# Patient Record
Sex: Female | Born: 1935 | Race: White | Hispanic: No | Marital: Married | Smoking: Current some day smoker
Health system: Southern US, Community
[De-identification: ages and names within clinical notes are randomized; demographics above are authoritative.]

## PROBLEM LIST (undated history)

## (undated) DIAGNOSIS — Z720 Tobacco use: Secondary | ICD-10-CM

## (undated) DIAGNOSIS — I219 Acute myocardial infarction, unspecified: Secondary | ICD-10-CM

## (undated) DIAGNOSIS — I739 Peripheral vascular disease, unspecified: Secondary | ICD-10-CM

## (undated) DIAGNOSIS — J449 Chronic obstructive pulmonary disease, unspecified: Secondary | ICD-10-CM

## (undated) DIAGNOSIS — I10 Essential (primary) hypertension: Secondary | ICD-10-CM

## (undated) DIAGNOSIS — I509 Heart failure, unspecified: Secondary | ICD-10-CM

## (undated) DIAGNOSIS — M1990 Unspecified osteoarthritis, unspecified site: Secondary | ICD-10-CM

## (undated) HISTORY — PX: NO PAST SURGERIES: SHX2092

---

## 2002-09-27 ENCOUNTER — Ambulatory Visit (HOSPITAL_COMMUNITY): Admission: RE | Admit: 2002-09-27 | Discharge: 2002-09-27 | Payer: Self-pay | Admitting: Internal Medicine

## 2003-08-28 ENCOUNTER — Ambulatory Visit (HOSPITAL_COMMUNITY): Admission: RE | Admit: 2003-08-28 | Discharge: 2003-08-28 | Payer: Self-pay | Admitting: Pediatrics

## 2004-08-01 ENCOUNTER — Ambulatory Visit (HOSPITAL_COMMUNITY): Admission: RE | Admit: 2004-08-01 | Discharge: 2004-08-01 | Payer: Self-pay | Admitting: Family Medicine

## 2005-03-31 ENCOUNTER — Ambulatory Visit (HOSPITAL_COMMUNITY): Admission: RE | Admit: 2005-03-31 | Discharge: 2005-03-31 | Payer: Self-pay | Admitting: Family Medicine

## 2006-10-23 ENCOUNTER — Ambulatory Visit (HOSPITAL_COMMUNITY): Admission: RE | Admit: 2006-10-23 | Discharge: 2006-10-23 | Payer: Self-pay | Admitting: Family Medicine

## 2006-11-27 ENCOUNTER — Ambulatory Visit (HOSPITAL_COMMUNITY): Admission: RE | Admit: 2006-11-27 | Discharge: 2006-11-27 | Payer: Self-pay | Admitting: Family Medicine

## 2007-09-23 ENCOUNTER — Ambulatory Visit (HOSPITAL_COMMUNITY): Admission: RE | Admit: 2007-09-23 | Discharge: 2007-09-23 | Payer: Self-pay | Admitting: Pediatrics

## 2007-10-01 ENCOUNTER — Encounter: Admission: RE | Admit: 2007-10-01 | Discharge: 2007-10-01 | Payer: Self-pay | Admitting: Pediatrics

## 2008-11-10 ENCOUNTER — Ambulatory Visit (HOSPITAL_COMMUNITY): Admission: RE | Admit: 2008-11-10 | Discharge: 2008-11-10 | Payer: Self-pay | Admitting: Pediatrics

## 2008-11-17 ENCOUNTER — Ambulatory Visit (HOSPITAL_COMMUNITY): Admission: RE | Admit: 2008-11-17 | Discharge: 2008-11-17 | Payer: Self-pay | Admitting: Family Medicine

## 2009-02-08 ENCOUNTER — Ambulatory Visit (HOSPITAL_COMMUNITY): Admission: RE | Admit: 2009-02-08 | Discharge: 2009-02-08 | Payer: Self-pay | Admitting: Family Medicine

## 2010-11-09 ENCOUNTER — Encounter: Payer: Self-pay | Admitting: Pediatrics

## 2010-11-10 ENCOUNTER — Encounter: Payer: Self-pay | Admitting: Family Medicine

## 2011-03-07 NOTE — Op Note (Signed)
NAME:  Erica Howard, Erica Howard                        ACCOUNT NO.:  [id number]   MEDICAL RECORD NO.:  [id number]                   PATIENT TYPE:  AMB   LOCATION:  DAY                                  FACILITY:  [facility]   PHYSICIAN:  R. Roetta Sessions, M.D.              DATE OF BIRTH:  October 22, 1926   DATE OF PROCEDURE:  09/27/2002  DATE OF DISCHARGE:                                 OPERATIVE REPORT   PROCEDURE:  Diagnostic colonoscopy.   ENDOSCOPIST:  Gerrit Friends. Rourk, M.D.   INDICATIONS FOR PROCEDURE:  The patient is a 77 year old lady who was found  to have 3 Hemoccults positive recently.  She is devoid of any lower GI tract  symptoms such as hematochezia, no melena.  No change in bowel habits.  No  abdominal pain.  Previous colonoscopy demonstrated only inflammatory polyps.  She does not have any upper GI tract symptoms such as odynophagia,  dysphagia, early satiety, reflux symptoms, nausea or vomiting.  Her CBC from  today is white count 8.5, H&H 16.0 and 48.6.  MCV 90.4.  Colonoscopy is now  being done to further evaluate Hemoccult positive stools.  This approach has  been discussed with the patient previously.  The potential risks, benefits,  and alternatives have been reviewed.  Questions answered.  Please see my  09/08/02 office dictated H&P for more information.   DESCRIPTION OF PROCEDURE:  O2 saturation, blood pressure, pulse and  respirations were monitored throughout the entire procedure.   CONSCIOUS SEDATION:  Versed 3 mg IV, Demerol 50 mg IV in divided doses.   INSTRUMENT:  Olympus video chip adult colonoscope.   FINDINGS:  Digital rectal exam revealed external hemorrhoids.   ENDOSCOPIC FINDINGS:  The prep was adequate.   RECTUM:  Examination of the rectal mucosa including the retroflex view of  the anal verge revealed only internal hemorrhoids.  Otherwise the rectal  mucosa appeared normal.   COLON:  The colonic mucosa was surveyed from the rectosigmoid junction  through  the left transverse and right colon to the area of the appendiceal  orifice, ileocecal valve, and cecum.  These structures were well seen and  photographed for the record.  The patient was noted to have left-sided  diverticulum.  The remainder of the colonic mucosa to the cecum appeared  normal.   From the level of the cecum and ileocecal valve the scope was slowly  withdrawn.  All previously mentioned mucosal surfaces were again seen; and,  again, no abnormalities were observed.  The patient tolerated the procedure  well and was reacted in endoscopy.  Internal and external hemorrhoids,  otherwise normal rectum.  Left-sided diverticula.  The remainder of the  colonic mucosa appeared normal.   IMPRESSION:  The patient may have bleed from hemorrhoids or may have false-  positive Hemoccults from dietary indiscretion prior to testing.  Today's  findings were reassuring.   RECOMMENDATIONS:  1. Hemorrhoidal  literature, diverticulosis literature provided to the     patient.  2. Repeat colonoscopy in 10 years.  3. Follow up with Dr. Milford Cage.                                               Jonathon Bellows, M.D.    RMR/MEDQ  D:  09/27/2002  T:  09/27/2002  Job:  [id number]   cc:   Francoise Schaumann. Halm, D.O.  [address]  [place]  [place] [address]  Fax: [phone]

## 2012-11-18 ENCOUNTER — Other Ambulatory Visit (HOSPITAL_COMMUNITY): Payer: Self-pay | Admitting: Internal Medicine

## 2012-11-18 DIAGNOSIS — I714 Abdominal aortic aneurysm, without rupture, unspecified: Secondary | ICD-10-CM

## 2012-11-18 DIAGNOSIS — I739 Peripheral vascular disease, unspecified: Secondary | ICD-10-CM

## 2012-11-19 ENCOUNTER — Ambulatory Visit (HOSPITAL_COMMUNITY): Payer: Medicare Other

## 2012-11-20 ENCOUNTER — Inpatient Hospital Stay (HOSPITAL_COMMUNITY)
Admission: AD | Admit: 2012-11-20 | Discharge: 2012-11-26 | DRG: 280 | Disposition: A | Discharge status: Home / Self Care | Payer: Medicare Other | Source: Other Acute Inpatient Hospital | Attending: Internal Medicine | Admitting: Internal Medicine

## 2012-11-20 DIAGNOSIS — I5181 Takotsubo syndrome: Secondary | ICD-10-CM | POA: Diagnosis present

## 2012-11-20 DIAGNOSIS — M810 Age-related osteoporosis without current pathological fracture: Secondary | ICD-10-CM | POA: Diagnosis present

## 2012-11-20 DIAGNOSIS — T380X5A Adverse effect of glucocorticoids and synthetic analogues, initial encounter: Secondary | ICD-10-CM | POA: Diagnosis present

## 2012-11-20 DIAGNOSIS — Z79899 Other long term (current) drug therapy: Secondary | ICD-10-CM

## 2012-11-20 DIAGNOSIS — I10 Essential (primary) hypertension: Secondary | ICD-10-CM | POA: Diagnosis present

## 2012-11-20 DIAGNOSIS — I5021 Acute systolic (congestive) heart failure: Secondary | ICD-10-CM

## 2012-11-20 DIAGNOSIS — E871 Hypo-osmolality and hyponatremia: Secondary | ICD-10-CM | POA: Diagnosis present

## 2012-11-20 DIAGNOSIS — E876 Hypokalemia: Secondary | ICD-10-CM | POA: Diagnosis not present

## 2012-11-20 DIAGNOSIS — I70209 Unspecified atherosclerosis of native arteries of extremities, unspecified extremity: Secondary | ICD-10-CM | POA: Diagnosis present

## 2012-11-20 DIAGNOSIS — J9600 Acute respiratory failure, unspecified whether with hypoxia or hypercapnia: Secondary | ICD-10-CM | POA: Diagnosis present

## 2012-11-20 DIAGNOSIS — J441 Chronic obstructive pulmonary disease with (acute) exacerbation: Secondary | ICD-10-CM | POA: Diagnosis present

## 2012-11-20 DIAGNOSIS — I4891 Unspecified atrial fibrillation: Secondary | ICD-10-CM | POA: Diagnosis not present

## 2012-11-20 DIAGNOSIS — J449 Chronic obstructive pulmonary disease, unspecified: Secondary | ICD-10-CM | POA: Diagnosis present

## 2012-11-20 DIAGNOSIS — R9431 Abnormal electrocardiogram [ECG] [EKG]: Secondary | ICD-10-CM | POA: Diagnosis present

## 2012-11-20 DIAGNOSIS — I509 Heart failure, unspecified: Secondary | ICD-10-CM

## 2012-11-20 DIAGNOSIS — I219 Acute myocardial infarction, unspecified: Secondary | ICD-10-CM

## 2012-11-20 DIAGNOSIS — I214 Non-ST elevation (NSTEMI) myocardial infarction: Principal | ICD-10-CM | POA: Diagnosis present

## 2012-11-20 DIAGNOSIS — Z7982 Long term (current) use of aspirin: Secondary | ICD-10-CM

## 2012-11-20 DIAGNOSIS — D72829 Elevated white blood cell count, unspecified: Secondary | ICD-10-CM | POA: Diagnosis present

## 2012-11-20 HISTORY — DX: Chronic obstructive pulmonary disease, unspecified: J44.9

## 2012-11-20 HISTORY — DX: Acute myocardial infarction, unspecified: I21.9

## 2012-11-20 HISTORY — DX: Essential (primary) hypertension: I10

## 2012-11-20 HISTORY — DX: Peripheral vascular disease, unspecified: I73.9

## 2012-11-20 HISTORY — DX: Heart failure, unspecified: I50.9

## 2012-11-20 HISTORY — DX: Tobacco use: Z72.0

## 2012-11-20 HISTORY — DX: Unspecified osteoarthritis, unspecified site: M19.90

## 2012-11-20 NOTE — Progress Notes (Signed)
Pt brought in by [facility] from [facility] hospital on the bipap

## 2012-11-21 ENCOUNTER — Encounter (HOSPITAL_COMMUNITY): Payer: Self-pay | Admitting: *Deleted

## 2012-11-21 ENCOUNTER — Inpatient Hospital Stay (HOSPITAL_COMMUNITY): Payer: Medicare Other

## 2012-11-21 DIAGNOSIS — I214 Non-ST elevation (NSTEMI) myocardial infarction: Secondary | ICD-10-CM | POA: Diagnosis present

## 2012-11-21 DIAGNOSIS — J441 Chronic obstructive pulmonary disease with (acute) exacerbation: Secondary | ICD-10-CM

## 2012-11-21 DIAGNOSIS — R072 Precordial pain: Secondary | ICD-10-CM

## 2012-11-21 DIAGNOSIS — R918 Other nonspecific abnormal finding of lung field: Secondary | ICD-10-CM

## 2012-11-21 DIAGNOSIS — R9431 Abnormal electrocardiogram [ECG] [EKG]: Secondary | ICD-10-CM | POA: Diagnosis present

## 2012-11-21 DIAGNOSIS — J9600 Acute respiratory failure, unspecified whether with hypoxia or hypercapnia: Secondary | ICD-10-CM

## 2012-11-21 LAB — CBC WITH DIFFERENTIAL/PLATELET
Eosinophils Absolute: 0 10*3/uL (ref 0.0–0.7)
Eosinophils Relative: 0 % (ref 0–5)
HCT: 38.6 % (ref 36.0–46.0)
Lymphocytes Relative: 2 % — ABNORMAL LOW (ref 12–46)
Lymphs Abs: 0.3 10*3/uL — ABNORMAL LOW (ref 0.7–4.0)
MCH: 30.5 pg (ref 26.0–34.0)
MCV: 88.5 fL (ref 78.0–100.0)
Monocytes Absolute: 0.6 10*3/uL (ref 0.1–1.0)
Platelets: 262 10*3/uL (ref 150–400)
RBC: 4.36 MIL/uL (ref 3.87–5.11)

## 2012-11-21 LAB — CBC
HCT: 37.2 % (ref 36.0–46.0)
MCH: 30.5 pg (ref 26.0–34.0)
MCV: 87.3 fL (ref 78.0–100.0)
Platelets: 271 10*3/uL (ref 150–400)
RDW: 13.6 % (ref 11.5–15.5)
WBC: 10.2 10*3/uL (ref 4.0–10.5)

## 2012-11-21 LAB — TROPONIN I
Troponin I: 1.28 ng/mL (ref <0.30)
Troponin I: 1.12 ng/mL (ref <0.30)

## 2012-11-21 LAB — URINALYSIS, ROUTINE W REFLEX MICROSCOPIC
Glucose, UA: NEGATIVE mg/dL
Hgb urine dipstick: NEGATIVE
Leukocytes, UA: NEGATIVE
Specific Gravity, Urine: 1.013 (ref 1.005–1.030)
pH: 5.5 (ref 5.0–8.0)

## 2012-11-21 LAB — LEGIONELLA ANTIGEN, URINE: Legionella Antigen, Urine: NEGATIVE

## 2012-11-21 LAB — POCT I-STAT 3, ART BLOOD GAS (G3+)
Acid-Base Excess: 1 mmol/L (ref 0.0–2.0)
O2 Saturation: 96 %
Patient temperature: 98.6
pO2, Arterial: 78 mmHg — ABNORMAL LOW (ref 80.0–100.0)

## 2012-11-21 LAB — COMPREHENSIVE METABOLIC PANEL
Albumin: 3.5 g/dL (ref 3.5–5.2)
BUN: 29 mg/dL — ABNORMAL HIGH (ref 6–23)
Creatinine, Ser: 0.94 mg/dL (ref 0.50–1.10)
GFR calc Af Amer: 63 mL/min — ABNORMAL LOW (ref >90)
Glucose, Bld: 170 mg/dL — ABNORMAL HIGH (ref 70–99)
Total Bilirubin: 0.6 mg/dL (ref 0.3–1.2)
Total Protein: 6.2 g/dL (ref 6.0–8.3)

## 2012-11-21 LAB — PROTIME-INR: Prothrombin Time: 13.6 seconds (ref 11.6–15.2)

## 2012-11-21 LAB — MAGNESIUM: Magnesium: 1.5 mg/dL (ref 1.5–2.5)

## 2012-11-21 LAB — PHOSPHORUS: Phosphorus: 3.8 mg/dL (ref 2.3–4.6)

## 2012-11-21 LAB — STREP PNEUMONIAE URINARY ANTIGEN: Strep Pneumo Urinary Antigen: NEGATIVE

## 2012-11-21 MED ORDER — CARVEDILOL 3.125 MG PO TABS
3.1250 mg | ORAL_TABLET | Freq: Two times a day (BID) | ORAL | Status: DC
  Administered 2012-11-21 – 2012-11-26 (×10): 3.125 mg via ORAL
  Filled 2012-11-21 (×12): qty 1

## 2012-11-21 MED ORDER — SODIUM CHLORIDE 0.9 % IV SOLN: 250.0000 mL | INTRAVENOUS | Status: DC | PRN

## 2012-11-21 MED ORDER — ASPIRIN 81 MG PO CHEW
81.0000 mg | CHEWABLE_TABLET | Freq: Every day | ORAL | Status: DC
  Administered 2012-11-21 – 2012-11-26 (×5): 81 mg via ORAL
  Filled 2012-11-21 (×5): qty 1

## 2012-11-21 MED ORDER — AMIODARONE LOAD VIA INFUSION
150.0000 mg | Freq: Once | INTRAVENOUS | Status: AC
  Administered 2012-11-21: 150 mg via INTRAVENOUS
  Filled 2012-11-21: qty 83.34

## 2012-11-21 MED ORDER — PANTOPRAZOLE SODIUM 40 MG IV SOLR
40.0000 mg | Freq: Every day | INTRAVENOUS | Status: DC
  Administered 2012-11-21 – 2012-11-22 (×3): 40 mg via INTRAVENOUS
  Filled 2012-11-21 (×4): qty 40

## 2012-11-21 MED ORDER — DIGOXIN 125 MCG PO TABS
0.1250 mg | ORAL_TABLET | Freq: Every day | ORAL | Status: DC
Start: 2012-11-22 — End: 2012-11-25
  Administered 2012-11-22 – 2012-11-24 (×3): 0.125 mg via ORAL
  Filled 2012-11-21 (×4): qty 1

## 2012-11-21 MED ORDER — LISINOPRIL 2.5 MG PO TABS
2.5000 mg | ORAL_TABLET | Freq: Every day | ORAL | Status: DC
  Filled 2012-11-21: qty 1

## 2012-11-21 MED ORDER — HYDROCODONE-ACETAMINOPHEN 5-325 MG PO TABS
1.0000 | ORAL_TABLET | ORAL | Status: DC | PRN
  Administered 2012-11-21 – 2012-11-26 (×11): 1 via ORAL
  Filled 2012-11-21 (×12): qty 1

## 2012-11-21 MED ORDER — BIOTENE DRY MOUTH MT LIQD
15.0000 mL | Freq: Two times a day (BID) | OROMUCOSAL | Status: DC
  Administered 2012-11-21 – 2012-11-22 (×3): 15 mL via OROMUCOSAL

## 2012-11-21 MED ORDER — AMIODARONE HCL IN DEXTROSE 360-4.14 MG/200ML-% IV SOLN: 60.0000 mg/h | INTRAVENOUS | Status: AC

## 2012-11-21 MED ORDER — ALBUTEROL SULFATE HFA 108 (90 BASE) MCG/ACT IN AERS
4.0000 | INHALATION_SPRAY | RESPIRATORY_TRACT | Status: DC | PRN
  Filled 2012-11-21: qty 6.7

## 2012-11-21 MED ORDER — ASPIRIN 81 MG PO CHEW
324.0000 mg | CHEWABLE_TABLET | ORAL | Status: AC
  Administered 2012-11-22: 324 mg via ORAL
  Filled 2012-11-21: qty 4

## 2012-11-21 MED ORDER — SODIUM CHLORIDE 0.9 % IV SOLN
250.0000 mL | INTRAVENOUS | Status: DC | PRN
  Administered 2012-11-20: 250 mL via INTRAVENOUS

## 2012-11-21 MED ORDER — HEPARIN (PORCINE) IN NACL 100-0.45 UNIT/ML-% IJ SOLN
900.0000 [IU]/h | INTRAMUSCULAR | Status: DC
  Administered 2012-11-20 – 2012-11-21 (×2): 800 [IU]/h via INTRAVENOUS
  Filled 2012-11-21 (×3): qty 250

## 2012-11-21 MED ORDER — FUROSEMIDE 10 MG/ML IJ SOLN
40.0000 mg | Freq: Two times a day (BID) | INTRAMUSCULAR | Status: DC
  Administered 2012-11-21 – 2012-11-23 (×6): 40 mg via INTRAVENOUS
  Filled 2012-11-21 (×10): qty 4

## 2012-11-21 MED ORDER — ASPIRIN 81 MG PO CHEW: 324.0000 mg | CHEWABLE_TABLET | ORAL | Status: AC

## 2012-11-21 MED ORDER — IPRATROPIUM-ALBUTEROL 18-103 MCG/ACT IN AERO
4.0000 | INHALATION_SPRAY | RESPIRATORY_TRACT | Status: DC
  Administered 2012-11-21 – 2012-11-25 (×23): 4 via RESPIRATORY_TRACT
  Filled 2012-11-21 (×3): qty 14.7

## 2012-11-21 MED ORDER — AMIODARONE HCL IN DEXTROSE 360-4.14 MG/200ML-% IV SOLN
INTRAVENOUS | Status: AC
  Administered 2012-11-21: 30.06 mg/h via INTRAVENOUS
  Filled 2012-11-21: qty 200

## 2012-11-21 MED ORDER — DEXTROSE 5 % IV SOLN
1.0000 g | Freq: Every day | INTRAVENOUS | Status: DC
  Administered 2012-11-21 – 2012-11-25 (×6): 1 g via INTRAVENOUS
  Filled 2012-11-21 (×8): qty 10

## 2012-11-21 MED ORDER — DEXTROSE 5 % IV SOLN
500.0000 mg | INTRAVENOUS | Status: DC
  Administered 2012-11-21 – 2012-11-23 (×4): 500 mg via INTRAVENOUS
  Filled 2012-11-21 (×4): qty 500

## 2012-11-21 MED ORDER — AMIODARONE HCL IN DEXTROSE 360-4.14 MG/200ML-% IV SOLN
30.0000 mg/h | INTRAVENOUS | Status: DC
  Administered 2012-11-21: 30 mg/h via INTRAVENOUS
  Administered 2012-11-21: 30.06 mg/h via INTRAVENOUS
  Administered 2012-11-22: 30 mg/h via INTRAVENOUS
  Filled 2012-11-21 (×6): qty 200

## 2012-11-21 MED ORDER — MORPHINE SULFATE 2 MG/ML IJ SOLN
2.0000 mg | INTRAMUSCULAR | Status: DC | PRN
  Administered 2012-11-21: 2 mg via INTRAVENOUS
  Filled 2012-11-21: qty 1

## 2012-11-21 MED ORDER — SODIUM CHLORIDE 0.9 % IJ SOLN
3.0000 mL | Freq: Two times a day (BID) | INTRAMUSCULAR | Status: DC
  Administered 2012-11-22: 3 mL via INTRAVENOUS

## 2012-11-21 MED ORDER — METHYLPREDNISOLONE SODIUM SUCC 125 MG IJ SOLR
60.0000 mg | Freq: Two times a day (BID) | INTRAMUSCULAR | Status: DC
  Administered 2012-11-22 – 2012-11-24 (×5): 60 mg via INTRAVENOUS
  Filled 2012-11-21 (×10): qty 0.96

## 2012-11-21 MED ORDER — SODIUM CHLORIDE 0.9 % IJ SOLN: 3.0000 mL | INTRAMUSCULAR | Status: DC | PRN

## 2012-11-21 MED ORDER — DIGOXIN 0.25 MG/ML IJ SOLN
0.1250 mg | Freq: Once | INTRAMUSCULAR | Status: AC
  Administered 2012-11-22: 0.125 mg via INTRAVENOUS
  Filled 2012-11-21 (×2): qty 0.5

## 2012-11-21 MED ORDER — ASPIRIN 300 MG RE SUPP: 300.0000 mg | RECTAL | Status: AC

## 2012-11-21 MED ORDER — CHLORHEXIDINE GLUCONATE 0.12 % MT SOLN
15.0000 mL | Freq: Two times a day (BID) | OROMUCOSAL | Status: DC
  Administered 2012-11-22: 15 mL via OROMUCOSAL

## 2012-11-21 NOTE — Progress Notes (Signed)
Back pain;   Start Vicodin

## 2012-11-21 NOTE — Progress Notes (Signed)
Pt on standby with bipap at this time.  Saturations are at 94% on a 3lpm [place].

## 2012-11-21 NOTE — Progress Notes (Signed)
  Echocardiogram 2D Echocardiogram has been performed.  Georgian Co 11/21/2012, 9:44 AM

## 2012-11-21 NOTE — Progress Notes (Addendum)
Patient ID: Erica Howard, female   DOB: [birth year], 79 y.o.   MRN: [id number]    SUBJECTIVE: Patient is on Bipap this morning.  She denies chest pain.  Still some dyspnea.  She is getting her echo done now: I reviewed the available pictures so far.  She has EF 25-30% with mid to apical severe hypokinesis of all walls.  The basal segments are preserved.  This could be consistent with CAD but also would be concerned for stress (Takotsubo-type) cardiomyopathy.      [name] albuterol-ipratropium  4 puff Inhalation Q4H  . antiseptic oral rinse  15 mL Mouth Rinse q12n4p  . aspirin  324 mg Oral NOW   Or  . aspirin  300 mg Rectal NOW  . azithromycin  500 mg Intravenous Q24H  . cefTRIAXone (ROCEPHIN)  IV  1 g Intravenous QHS  . chlorhexidine  15 mL Mouth Rinse BID  . methylPREDNISolone (SOLU-MEDROL) injection  60 mg Intravenous Q12H  . pantoprazole (PROTONIX) IV  40 mg Intravenous QHS      Filed Vitals:   11/21/12 0500 11/21/12 0600 11/21/12 0700 11/21/12 0800  BP: 99/47 101/56 101/57 103/57  Pulse: 103 103 106 104  Temp:      TempSrc:      Resp: 20 21 21 21   Height:      Weight: 139 lb 15.9 oz (63.5 kg)     SpO2: 97% 98% 96% 96%    Intake/Output Summary (Last 24 hours) at 11/21/12 0845 Last data filed at 11/21/12 0700  Gross per 24 hour  Intake  448.5 ml  Output    625 ml  Net -176.5 ml    LABS: Basic Metabolic Panel:  Basename 11/21/12 0026  NA 128*  K 3.7  CL 88*  CO2 25  GLUCOSE 170*  BUN 29*  CREATININE 0.94  CALCIUM 10.8*  MG 1.5  PHOS 3.8   Liver Function Tests:  Basename 11/21/12 0026  AST 37  ALT 29  ALKPHOS 44  BILITOT 0.6  PROT 6.2  ALBUMIN 3.5   No results found for this basename: LIPASE:2,AMYLASE:2 in the last 72 hours CBC:  Basename 11/21/12 0545 11/21/12 0026  WBC 10.2 14.5*  NEUTROABS -- 13.5*  HGB 13.0 13.3  HCT 37.2 38.6  MCV 87.3 88.5  PLT 271 262   Cardiac Enzymes:  Basename 11/21/12 0545 11/21/12 0018  CKTOTAL -- --  CKMB -- --    CKMBINDEX -- --  TROPONINI 1.28* 1.36*   BNP:18,900  RADIOLOGY: Portable Chest Xray  11/21/2012  *RADIOLOGY REPORT*  Clinical Data: Short of breath.  Frequent falls.  Weakness.  PORTABLE CHEST - 1 VIEW  Comparison: None.  Findings: Mild cardiomegaly is seen as well as ectasia of the thoracic aorta.  Diffuse interstitial infiltrates are seen suspicious for mild interstitial edema.  No evidence of pulmonary air space disease or pleural effusion.  IMPRESSION: Mild cardiomegaly and diffuse interstitial infiltrates, suspicious for mild interstitial edema/congestive heart failure.   Original Report Authenticated By: [name], M.D.     PHYSICAL EXAM General: NAD, on Bipap Neck: JVP 8-9 cm, no thyromegaly or thyroid nodule.  Lungs: Coarse breath sounds bilaterally CV: Nondisplaced PMI.  Heart regular S1/S2, no S3/S4, no murmur.  No peripheral edema.  Unable to feel pedal pulses.  Abdomen: Soft, nontender, no hepatosplenomegaly, no distention.  Neurologic: Alert and oriented x 3.  Psych: Normal affect. Extremities: No clubbing or cyanosis.   TELEMETRY: Reviewed telemetry pt in sinus tachycardia, rate 100s  ASSESSMENT  AND PLAN: 79 yo with h/o COPD presented with dyspnea and chest pain.  She was noted to have elevated troponin and echo today shows peri-apical severe hypokinesis with EF around 25-30%.   1. COPD exacerbation: Per CCM.  She is on nebs and steroids.   2. CAD: TnI elevated => NSTEMI versus possible Takotsubo.  ECG with RBBB.  No prior history of CAD but does have apparent history of PAD.  She has peri-apical severe hypokinesis on echo with preserved basal segment function.  She may have significant CAD or this may be a Takotsubo-type cardiomyopathy picture related to medical illness (COPD exacerbation).   - Add ASA 81 and statin. - Continue heparin gtt.  - Will plan on LHC tomorrow.  3. CHF: Acute systolic CHF.  BNP high, JVP elevated, CHF on CXR.  Possible stress (Takotsubo-type)  CMP.   - Lasix 40 mg IV bid today - Will need to start ACEI and beta blocker.  Given volume overload and tenuous BP, hold off on beta blocker today.  Can given very low dose ACEI.  4. PAD: Patient has chronic bilateral foot pain and lack of peripheral pulses on exam.  Should get peripheral arterial doppler evaluation.   [name] 11/21/2012 8:58 AM  Patient went into atrial fibrillation with RVR this morning to the 130s.  SBP in 100s.  She is on a heparin gtt.  I am going to start amiodarone gtt.    [name] 11/21/2012 11:14 AM

## 2012-11-21 NOTE — H&P (Addendum)
Name: Erica Howard MRN: [id number] DOB: 1927/04/20    LOS: 1  PULMONARY / CRITICAL CARE MEDICINE  HPI:   79 years old female with PMH relevant for non oxygen dependent COPD, HTN, PAD and OA on chronic narcotics. She is a current smoker of 1 1/2 to 2 packs per day. On combivent at home. Recently diagnosed with poison ivy related skin rash and started on prednisone taper 3 days ago. For the last two weeks with intermittent chest pain, unclear are the characteristics and duration of pain and if it is exercise induced since the patient is on BiPAP and is poor historian. For the last two weeks she has had progressive worsening of wheezing and SOB. Her chronic cough and sputum production are stable. Denies fever or chills. Admitted initially to [facility] ED where she was found in moderate to severe respiratory distress. On exam she had crackles, wheezing and JVD. She was given lasix, NTG patch, metoprolol, IV steroids and respiratory treatments with good response. Initial ABG showed a pH: 7.38, PCO2: 37, PO2 64, Bicarb: 21 and O2 sat 94% on RA. She had an elevated troponin of 1.67, a BNP of 1100, elevated WBC of 15.3, normal kidney function, normal Hgb. Her chest X ray showed increased vascular congestion, possibly small bilateral pleural effusions, blurring of the left hemidiaphragm tha may be related to e LLL consolidation vs effusion. At the time of my exam the patient is doing significantly better as per her daughter at bedside. The patient is awake, alert, oriented, slightly hypotensive (84/67), HR of 106,  breathing comfortably on BiPAP, saturating 98% on 30% FiO2.  PMH: - Non oxygen dependent COPD - HTN - PAD - OA - Osteoporosis - Recent poison ivy related rash  Allergies - Ibuprofen  Family History - Father died of MI at the age of 69 - Mother died of MI and CHF at the age of 6  Social History - Smoker of 1 1/2 to 2 packs per year for many years - No ETOH or drug abuse - Lives  with her husband  Medications: - Verapamil 120 mg daily - Aspirin 81 mg daily - Prednisone taper since Wednesday - Combivent PRN - Miralax - Enalapril / HCTZ 10/25 BID - Lortab 5/500 2 tablets q 4 hrs - Multivitamin  Review Of Systems:  All systems reviewed and found negative except for what I mentioned in the HPI.   Vital Signs: Temp:  [97.5 F (36.4 C)-97.8 F (36.6 C)] 97.8 F (36.6 C) (02/02 0000) Pulse Rate:  [102-109] 102  (02/02 0000) Resp:  [19-26] 19  (02/02 0000) BP: (84-93)/(53-67) 90/57 mmHg (02/02 0000) SpO2:  [93 %-96 %] 96 % (02/02 0000) FiO2 (%):  [30 %] 30 % (02/02 0000) Weight:  [139 lb 1.8 oz (63.1 kg)] 139 lb 1.8 oz (63.1 kg) (02/01 2300)  Physical Examination: General:  On BiPAP, mild respiratory distress. Neuro:  Awake, alert, nonfocal HEENT:  PERRL, pink conjunctivae, moist membranes Neck:  Supple, no JVD   Cardiovascular:  RRR, no M/R/G Lungs:  Bilateral diminished air entry, no W/R/R Abdomen:  Soft, nontender, nondistended, bowel sounds present Musculoskeletal:  Moves all extremities, no pedal edema Skin:  No rash  Active problems - Acute COPD exacerbation - Questionable non STEMI - Questionable acute pulmonary edema - Acute hypoxemic respiratory failure  ASSESSMENT AND PLAN  PULMONARY CXR: (from [facility])  Increased vascular congestion. Possibly small bilateral pleural effusions. Blurring of the left hemidiaphragm that may be related to LLL  infiltrate.   A:   - Acute COPD exacerbation - Acute hypoxemic respiratory failure P:   - BiPAP 12/5, FiO2 30% - Solumedrol 60 mg IV q12 hrs - Rocephin / Zithromax IV - Sputum cultures, urine legionella and strep antigens. - Combivent q4 hrs - Albuterol PRN - Chest X ray  CARDIOVASCULAR No results found for this basename: TROPONINI:5,LATICACIDVEN:5, O2SATVEN:5,PROBNP:5 in the last 168 hours ECG:  Sinus rhythm, RBBB, pathologic q waves on inferior leads, poor progression of R in  V6-V6 Lines: Peripheral lines  A:  - Elevated BNP, elevated troponin and increased vascular congestion on chest X ray. History of chest pain but not active now. Pathologic q waves on inferior leads with no acute ST or T wave changes. Possibly recent MI with pulmonary edema. Now slightly hypotensive. P:  - We will trend troponin - We will continue heparin drip - Continue aspirin - We will remove NTG patch due to hypotension - We will hold lasix overnight - Hold home antihypertensives for now. - Echocardiogram in am - Cardiology consult   RENAL No results found for this basename: NA:5,K:2,CL:5,CO2:5,BUN:5,CREATININE:5,CALCIUM:5,MG:5,PHOS:5 in the last 168 hours Intake/Output      02/01 0701 - 02/02 0700   I.V. (mL/kg) 27.5 (0.4)   IV Piggyback 10   Total Intake(mL/kg) 37.5 (0.6)   Net +37.5         A:   - Normal kidney function P:   - Will repeat CMP  GASTROINTESTINAL No results found for this basename: AST:5,ALT:5,ALKPHOS:5,BILITOT:5,PROT:5,ALBUMIN:5 in the last 168 hours  A:   - No issues P:   - GI prophylaxis with protonix  HEMATOLOGIC No results found for this basename: HGB:5,HCT:5,PLT:5,INR:5,APTT:5 in the last 168 hours A:   - No issues P:  Will follow CBC  INFECTIOUS No results found for this basename: WBC:5,PROCALCITON:5 in the last 168 hours Cultures: - Will send sputum cultures Antibiotics: - Rocephin - Zithromax  A:   - COPD exacerbation. Questionable LLL infiltrate P:   - Antibiotics as above.  ENDOCRINE No results found for this basename: GLUCAP:5 in the last 168 hours A:  - No issues   NEUROLOGIC  A:   - Awake and alert, non focal P:   - Pain management with PRN morphine  BEST PRACTICE / DISPOSITION - Level of Care:  ICU - Primary Service:  PCCM - Consultants:  Cardiology - Code Status:  Full code confirmed with daughter at bedside - Diet:  NPO, will advance in am - DVT Px:  Heparin drip and SCD's - GI Px:  IV protonix -  Skin Integrity:  Intact - Social / Family:  Daughter updated at bedside     Critical Care Time devoted to patient care services described in this note is: 1 Hour  Overton Mam, M.D. Pulmonary and Critical Care Medicine [facility] Pager: [phone]  11/21/2012, 12:37 AM

## 2012-11-21 NOTE — Progress Notes (Addendum)
  ANTICOAGULATION CONSULT NOTE - Follow Up Consult  Pharmacy Consult for Heparin Indication: chest pain/ACS  Allergies  Allergen Reactions  . Ibuprofen Rash    Patient Measurements: Height: 5' 3.5" (161.3 cm) Weight: 139 lb 15.9 oz (63.5 kg) IBW/kg (Calculated) : 53.55  Heparin Dosing Weight: 63.5 kg  Vital Signs: Temp: 98.5 F (36.9 C) (02/02 0400) Temp src: Axillary (02/02 0400) BP: 103/57 mmHg (02/02 0800) Pulse Rate: 104  (02/02 0800)  Labs:  Basename 11/21/12 0545 11/21/12 0026 11/21/12 0018  HGB 13.0 13.3 --  HCT 37.2 38.6 --  PLT 271 262 --  APTT -- 110* --  LABPROT -- 13.6 --  INR -- 1.05 --  HEPARINUNFRC 0.59 -- --  CREATININE -- 0.94 --  CKTOTAL -- -- --  CKMB -- -- --  TROPONINI 1.28* -- 1.36*    Estimated Creatinine Clearance: 38.4 ml/min (by C-G formula based on Cr of 0.94).   Medications:  Scheduled:    . albuterol-ipratropium  4 puff Inhalation Q4H  . antiseptic oral rinse  15 mL Mouth Rinse q12n4p  . aspirin  324 mg Oral NOW   Or  . aspirin  300 mg Rectal NOW  . azithromycin  500 mg Intravenous Q24H  . cefTRIAXone (ROCEPHIN)  IV  1 g Intravenous QHS  . chlorhexidine  15 mL Mouth Rinse BID  . methylPREDNISolone (SOLU-MEDROL) injection  60 mg Intravenous Q12H  . pantoprazole (PROTONIX) IV  40 mg Intravenous QHS   Infusions:    . heparin 800 Units/hr (11/20/12 2130)    Assessment: 79 y/o female transferred from [facility] with intermittent CP and respiratory distress. She continues on heparin for CP/ACS. Heparin level is therapeutic (0.59) on current rate of 800 units/hr. Baseline PT/INR is 13.6/1.05. No bleeding noted.  Goal of Therapy:  Heparin level 0.3-0.7 units/ml Monitor platelets by anticoagulation protocol: Yes   Plan:  Start heparin infusion at 800 units/hr Check anti-Xa level in 8 hours and daily while on heparin Continue to monitor H&H and platelets  Lillia Pauls, PharmD Clinical Pharmacist 11/21/2012 8:51  AM  ---- Addendum: Confirmatory HL is therapeutic at 0.33. Will continue current rate of 800 units/hr and f/u with daily heparin levels.  Lillia Pauls, PharmD Clinical Pharmacist 11/21/2012 2:40 PM

## 2012-11-21 NOTE — Progress Notes (Signed)
ANTICOAGULATION/ANTIBIOTIC CONSULT NOTE - Initial Consult  Pharmacy Consult for heparin and Rocephin Indication: chest pain/ACS and COPD exacerbation  Patient Measurements: Height: 5' 3.5" (161.3 cm) Weight: 139 lb 1.8 oz (63.1 kg) IBW/kg (Calculated) : 53.55   Vital Signs: Temp: 97.8 F (36.6 C) (02/02 0000) Temp src: Oral (02/02 0000) BP: 90/57 mmHg (02/02 0000) Pulse Rate: 102  (02/02 0000)   Medical History: No past medical history on file.   Assessment: 79yo female presented to [facility] c/o intermittent CP, found to be in respiratory distress with elevated troponin, to continue heparin and begin IV ABX.  Goal of Therapy:  Heparin level 0.3-0.7 units/ml Monitor platelets by anticoagulation protocol: Yes   Plan:  Rec'd heparin 4000 units IV bolus x1 followed by gtt at 800 units/hr; will continue gtt and monitor heparin levels and CBC.  Will begin Rocephin 1g IV Q24H.  Colleen Can PharmD BCPS 11/21/2012,12:33 AM

## 2012-11-21 NOTE — Progress Notes (Signed)
Patient's BP persistently low 78/53 & 72/47.  Skin warm & dry; denies SOB; no change in mentation.  Dr Antoine Poche called and made aware.  No new orders.

## 2012-11-21 NOTE — Consult Note (Signed)
CARDIOLOGY CONSULT NOTE  Patient ID: Erica Howard MRN: [id number] DOB/AGE: October 01, 1927 79 y.o.  Admit date: 11/20/2012 Primary Physician Dr. Catalina Pizza Primary Cardiologist   None Chief Complaint  Acute respiratory failure  HPI:  The patient transferred from [facility].  She presented there after a fall.  This was preceded by an acute episode of chest pain.  In the ER at [facility] she was found to have hypoxemia with a BNP of 1310 and a CXR with diffuse interstitial infiltrates and possible edema.  The troponin was elevated at 1.67. D. dimer was also slightly elevated.  EKG demonstrated sinus tachycardia with right bundle branch block, axis deviation and a probable old inferior infarct. The patient was felt to be having an exacerbation her chronic lung disease. She was treated with bronchodilator.  She was treated with bronchodilators. She also received IV Lasix.  The patient was transferred for management by CCM.  We're consulted with her chest pain and elevated enzymes.  She has no past cardiac history. She has had no previous cardiac workup.  She's very vague about chest pain. She cannot quantify or qualify the discomfort she had tonight. She doesn't seem to describe any neck or arm discomfort. She is limited it sounds more from chronic lung disease.  She does not use home O2. She does get dyspneic with activities and may get some chest tightness with this but she doesn't "paying any attention." She does not describe PND or orthopnea. She does not report palpitations, presyncope or syncope. She does have claudication symptoms and has been told that she has decreased pulses in her lower extremities.   Past Medical History  Diagnosis Date  . COPD (chronic obstructive pulmonary disease)   . Peripheral vascular disease   . Hypertension   . Arthritis   . Myocardial infarction 11/20/2012  . CHF (congestive heart failure) 11/20/2012  . Tobacco abuse     Past Surgical History  Procedure  Date  . No past surgeries     Allergies  Allergen Reactions  . Ibuprofen Rash   No prescriptions prior to admission   Home Medications  Verapamil 120 mg daily Enalapril/HCTZ 10/25 daily MVI Lortab q4h prn Miralax 17 gm daily ComBivent prn    Family History  Problem Relation Age of Onset  . Sudden death Father 37    Possible MI  . CAD Mother 46    MI    History   Social History  . Marital Status: N/A    Spouse Name: N/A    Number of Children: N/A  . Years of Education: N/A   Occupational History  . Not on file.   Social History Main Topics  . Smoking status: Current Every Day Smoker -- 1.0 packs/day for 60 years    Types: Cigarettes  . Smokeless tobacco: Never Used  . Alcohol Use: No  . Drug Use: No  . Sexually Active:    Other Topics Concern  . Not on file   Social History Narrative   She lives in [place] with her husband.       ROS:  Constipation, chronic back pain, recent rash. Otherwise as stated in the HPI and negative for all other systems.  Physical Exam: Blood pressure 92/54, pulse 100, temperature 97.8 F (36.6 C), temperature source Oral, resp. rate 21, height 5' 3.5" (1.613 m), weight 139 lb 1.8 oz (63.1 kg), SpO2 96.00%.  GENERAL:  Well appearing HEENT:  Pupils equal round and reactive, fundi not visualized, oral mucosa unremarkable  NECK:  No jugular venous distention, waveform within normal limits, carotid upstroke brisk and symmetric, no bruits, no thyromegaly LYMPHATICS:  No cervical, inguinal adenopathy LUNGS:  Decreased breath sounds with wheezing. BACK:  No CVA tenderness CHEST:  Unremarkable HEART:  PMI not displaced or sustained,S1 and S2 within normal limits, no S3, no S4, no clicks, no rubs, no murmurs, distant heart sounds ABD:  Flat, positive bowel sounds normal in frequency in pitch, no bruits, no rebound, no guarding, no midline pulsatile mass, no hepatomegaly, no splenomegaly EXT:  2 plus pulses upper, diminished dorsalis pedis  and posterior tibialis bilateral lower, no edema, no cyanosis no clubbing SKIN:  No rashes no nodules NEURO:  Cranial nerves II through XII grossly intact, motor grossly intact throughout PSYCH:  Cognitively intact, oriented to person place and time   Labs:  D Dimer 0.82, creatinine 0.9, sodium 130, potassium 3.9  Lab Results  Component Value Date   WBC 14.5* 11/21/2012   HGB 13.3 11/21/2012   HCT 38.6 11/21/2012   MCV 88.5 11/21/2012   PLT 262 11/21/2012     Radiology:  CXR:  Mild cardiomegaly and diffuse interstitial infiltrates, suspicious  for mild interstitial edema/congestive heart failure.   EKG:NSR,  RBBB, LAD, old inferior infarct.  11/21/2012  ASSESSMENT AND PLAN:   NQWMI: This could be related to the acute exacerbation of chronic lung disease. However, she certainly is likely to have underlying coronary disease. We'll start with an echocardiogram. She will likely need cardiac catheterization during this admission. I agree with this full dose anticoagulation and aspirin.    HYPOTENSION For now we are avoiding her verapamil and enalapril.  We will also hold NTG paste.  ACUTE RESPIRATORY FAILURE Plans for CCM. We will check an echocardiogram.   Signed: [name] 11/21/2012, 1:41 AM

## 2012-11-21 NOTE — Progress Notes (Signed)
CRITICAL VALUE ALERT  Critical value received:  Troponin = 1.36  Date of notification:  11/21/2012  Time of notification:  0134  Critical value read back:yes  Nurse who received alert:  Ruffin Pyo  MD notified (1st page):  Dr Antoine Poche informed at bedside  Time of first page:  0135  MD notified (2nd page):  Time of second page:  Responding MD: Dr Antoine Poche  Time MD responded:  [phone]

## 2012-11-22 ENCOUNTER — Encounter (HOSPITAL_COMMUNITY)
Admission: AD | Disposition: A | Discharge status: Home / Self Care | Payer: Self-pay | Source: Other Acute Inpatient Hospital | Attending: Pulmonary Disease

## 2012-11-22 DIAGNOSIS — I10 Essential (primary) hypertension: Secondary | ICD-10-CM | POA: Diagnosis present

## 2012-11-22 DIAGNOSIS — I4891 Unspecified atrial fibrillation: Secondary | ICD-10-CM

## 2012-11-22 DIAGNOSIS — I2510 Atherosclerotic heart disease of native coronary artery without angina pectoris: Secondary | ICD-10-CM

## 2012-11-22 DIAGNOSIS — J449 Chronic obstructive pulmonary disease, unspecified: Secondary | ICD-10-CM

## 2012-11-22 DIAGNOSIS — E876 Hypokalemia: Secondary | ICD-10-CM

## 2012-11-22 DIAGNOSIS — E871 Hypo-osmolality and hyponatremia: Secondary | ICD-10-CM | POA: Diagnosis present

## 2012-11-22 DIAGNOSIS — I5021 Acute systolic (congestive) heart failure: Secondary | ICD-10-CM

## 2012-11-22 HISTORY — PX: LEFT HEART CATHETERIZATION WITH CORONARY ANGIOGRAM: SHX5451

## 2012-11-22 LAB — CBC
Hemoglobin: 12.6 g/dL (ref 12.0–15.0)
MCH: 30.4 pg (ref 26.0–34.0)
MCHC: 34.4 g/dL (ref 30.0–36.0)

## 2012-11-22 LAB — LIPID PANEL
Cholesterol: 118 mg/dL (ref 0–200)
Triglycerides: 79 mg/dL (ref <150)
VLDL: 16 mg/dL (ref 0–40)

## 2012-11-22 LAB — POCT ACTIVATED CLOTTING TIME: Activated Clotting Time: 198 seconds

## 2012-11-22 LAB — BASIC METABOLIC PANEL
Calcium: 9.7 mg/dL (ref 8.4–10.5)
GFR calc non Af Amer: 55 mL/min — ABNORMAL LOW (ref >90)
Glucose, Bld: 131 mg/dL — ABNORMAL HIGH (ref 70–99)
Sodium: 127 mEq/L — ABNORMAL LOW (ref 135–145)

## 2012-11-22 SURGERY — LEFT HEART CATHETERIZATION WITH CORONARY ANGIOGRAM
Anesthesia: LOCAL

## 2012-11-22 MED ORDER — ALPRAZOLAM 0.25 MG PO TABS
0.2500 mg | ORAL_TABLET | Freq: Three times a day (TID) | ORAL | Status: DC | PRN
  Administered 2012-11-22: 0.25 mg via ORAL
  Filled 2012-11-22: qty 1

## 2012-11-22 MED ORDER — HEPARIN SODIUM (PORCINE) 1000 UNIT/ML IJ SOLN
INTRAMUSCULAR | Status: AC
  Filled 2012-11-22: qty 2

## 2012-11-22 MED ORDER — ONDANSETRON HCL 4 MG/2ML IJ SOLN: 4.0000 mg | Freq: Four times a day (QID) | INTRAMUSCULAR | Status: DC | PRN

## 2012-11-22 MED ORDER — SODIUM CHLORIDE 0.9 % IV SOLN: INTRAVENOUS | Status: DC

## 2012-11-22 MED ORDER — SODIUM CHLORIDE 0.9 % IV SOLN: INTRAVENOUS | Status: AC

## 2012-11-22 MED ORDER — SIMVASTATIN 40 MG PO TABS
40.0000 mg | ORAL_TABLET | Freq: Every day | ORAL | Status: DC
  Administered 2012-11-22 – 2012-11-25 (×4): 40 mg via ORAL
  Filled 2012-11-22 (×5): qty 1

## 2012-11-22 MED ORDER — LIDOCAINE HCL (PF) 1 % IJ SOLN
INTRAMUSCULAR | Status: AC
  Filled 2012-11-22: qty 30

## 2012-11-22 MED ORDER — POLYETHYLENE GLYCOL 3350 17 G PO PACK
17.0000 g | PACK | Freq: Every day | ORAL | Status: DC
  Administered 2012-11-22 – 2012-11-25 (×3): 17 g via ORAL
  Filled 2012-11-22 (×5): qty 1

## 2012-11-22 MED ORDER — HEPARIN (PORCINE) IN NACL 2-0.9 UNIT/ML-% IJ SOLN
INTRAMUSCULAR | Status: AC
  Filled 2012-11-22: qty 1000

## 2012-11-22 MED ORDER — POTASSIUM CHLORIDE 10 MEQ/100ML IV SOLN
10.0000 meq | INTRAVENOUS | Status: AC
  Administered 2012-11-22 (×3): 10 meq via INTRAVENOUS
  Filled 2012-11-22: qty 400

## 2012-11-22 MED ORDER — NITROGLYCERIN 1 MG/10 ML FOR IR/CATH LAB
INTRA_ARTERIAL | Status: AC
  Filled 2012-11-22: qty 10

## 2012-11-22 MED ORDER — VERAPAMIL HCL 2.5 MG/ML IV SOLN
INTRAVENOUS | Status: AC
  Filled 2012-11-22: qty 2

## 2012-11-22 MED ORDER — HEPARIN (PORCINE) IN NACL 100-0.45 UNIT/ML-% IJ SOLN
900.0000 [IU]/h | INTRAMUSCULAR | Status: DC
  Filled 2012-11-22: qty 250

## 2012-11-22 MED ORDER — HEPARIN SODIUM (PORCINE) 5000 UNIT/ML IJ SOLN
5000.0000 [IU] | Freq: Three times a day (TID) | INTRAMUSCULAR | Status: DC
  Administered 2012-11-23 – 2012-11-26 (×10): 5000 [IU] via SUBCUTANEOUS
  Filled 2012-11-22 (×13): qty 1

## 2012-11-22 MED ORDER — POTASSIUM CHLORIDE CRYS ER 20 MEQ PO TBCR
40.0000 meq | EXTENDED_RELEASE_TABLET | Freq: Once | ORAL | Status: AC
  Administered 2012-11-22: 40 meq via ORAL
  Filled 2012-11-22: qty 2

## 2012-11-22 MED ORDER — ACETAMINOPHEN 325 MG PO TABS: 650.0000 mg | ORAL_TABLET | ORAL | Status: DC | PRN

## 2012-11-22 NOTE — Progress Notes (Signed)
UR Completed.  Erica Howard Jane [phone] 11/22/2012  

## 2012-11-22 NOTE — Progress Notes (Addendum)
ANTICOAGULATION CONSULT NOTE - Follow Up Consult  Pharmacy Consult for heparin Indication: chest pain/ACS  Allergies  Allergen Reactions  . Ibuprofen Rash    Patient Measurements: Height: 5' 3.5" (161.3 cm) Weight: 131 lb 6.3 oz (59.6 kg) IBW/kg (Calculated) : 53.55  Heparin Dosing Weight: 59.6 kg  Vital Signs: Temp: 98.2 F (36.8 C) (02/03 0800) Temp src: Oral (02/03 0800) BP: 101/56 mmHg (02/03 1509) Pulse Rate: 80  (02/03 1509)  Labs:  Basename 11/22/12 0640 11/21/12 1331 11/21/12 1109 11/21/12 0545 11/21/12 0026 11/21/12 0018  HGB 12.6 -- -- 13.0 -- --  HCT 36.6 -- -- 37.2 38.6 --  PLT 249 -- -- 271 262 --  APTT -- -- -- -- 110* --  LABPROT -- -- -- -- 13.6 --  INR -- -- -- -- 1.05 --  HEPARINUNFRC 0.19* 0.33 -- 0.59 -- --  CREATININE 0.93 -- -- -- 0.94 --  CKTOTAL -- -- -- -- -- --  CKMB -- -- -- -- -- --  TROPONINI -- -- 1.12* 1.28* -- 1.36*    Estimated Creatinine Clearance: 38.8 ml/min (by C-G formula based on Cr of 0.93).   Assessment: 79yo F presented to [facility] c/o intermittent CP x 2wks, found to be in respiratory distress with elevated troponin, Tx to [facility] 2/2. EKG on admit showed sinus tach and RBBB. 2/2 ECHO shows EF 35-40% w mid-apical severe hypokinesis of all walls.  2/3 LHC showed Takotsubo-type cardiomyopathy to be managed medically.  Heparin per Rx ACS/STEMI to start 8hrs post sheath removal (@ 13:15 on 2/3)- start at 2130. BL INR 1.05.   Patient previously therapeutic on full-dose heparin at 800 units/hr, but found to have level in SUB-therapeutic range (0.19) this am and heparin rate was increased to 900 units/hr. Heparin gtt was turned off for left heart cath prior to another level being obtained.  CBC stable from admit, no bleeding issues per RN.   Goal of Therapy:  Heparin level 0.3-0.7 units/ml Monitor platelets by anticoagulation protocol: Yes   Plan:  -restart heparin gtt at 900 units/hr to start at 2130 on 2/3 (8 hours  post-sheath removal) -f/u AM HL, CBC -monitor for s/sx bleeding -f/u sheath removal site   Thank you for the consult.  Tomi Bamberger, PharmD Clinical Pharmacist Pager: [phone] Pharmacy: [phone] 11/22/2012 3:50 PM    Addendum:  Sherron Monday with Dr. Antoine Poche about indication for continuation of heparin. There is no indication to continue with treatment-dose heparin in patient thought to have Takatsubo's cardiomyopathy, and heparin will now be changed to VTE prophylaxis dose.  Plan: -D/c heparin gtt orders -Start heparin 5000 units [place] q8h tomorrow at 0600 AM -D/c daily heparin level and CBC -F/u any bleeding issues  Thank you for the consult.  Tomi Bamberger, PharmD Clinical Pharmacist Pager: [phone] Pharmacy: [phone] 11/22/2012 4:19 PM

## 2012-11-22 NOTE — Interval H&P Note (Signed)
History and Physical Interval Note:  11/22/2012 1:20 PM  Erica Howard  has presented today for surgery, with the diagnosis of NSTEMI  The various methods of treatment have been discussed with the patient and family. After consideration of risks, benefits and other options for treatment, the patient has consented to  Procedure(s) (LRB) with comments: LEFT HEART CATHETERIZATION WITH CORONARY ANGIOGRAM (N/A) as a surgical intervention .  The patient's history has been reviewed, patient examined, no change in status, stable for surgery.  I have reviewed the patient's chart and labs.  Questions were answered to the patient's satisfaction.     [name]

## 2012-11-22 NOTE — H&P (Signed)
Name: Erica Howard MRN: [id number] DOB: 1927/05/07    LOS: 2  PULMONARY / CRITICAL CARE MEDICINE  HPI:   79 years old female with PMH relevant for non oxygen dependent COPD, HTN, PAD and OA on chronic narcotics. She is a current smoker of 1 1/2 to 2 packs per day. On combivent at home. Recently diagnosed with poison ivy related skin rash and started on prednisone taper 3 days ago. For the last two weeks with intermittent chest pain, unclear are the characteristics and duration of pain and if it is exercise induced since the patient is on BiPAP and is poor historian. For the last two weeks she has had progressive worsening of wheezing and SOB. Her chronic cough and sputum production are stable. Denies fever or chills. Admitted initially to [facility] ED where she was found in moderate to severe respiratory distress. On exam she had crackles, wheezing and JVD. She was given lasix, NTG patch, metoprolol, IV steroids and respiratory treatments with good response. Initial ABG showed a pH: 7.38, PCO2: 37, PO2 64, Bicarb: 21 and O2 sat 94% on RA. She had an elevated troponin of 1.67, a BNP of 1100, elevated WBC of 15.3, normal kidney function, normal Hgb. Her chest X ray showed increased vascular congestion, possibly small bilateral pleural effusions, blurring of the left hemidiaphragm tha may be related to e LLL consolidation vs effusion. At the time of my exam the patient is doing significantly better as per her daughter at bedside. The patient is awake, alert, oriented, slightly hypotensive (84/67), HR of 106,  breathing comfortably on BiPAP, saturating 98% on 30% FiO2.  Active problems - Acute COPD exacerbation - Questionable non STEMI - Questionable acute pulmonary edema - Acute hypoxemic respiratory failure  ASSESSMENT AND PLAN  PULMONARY CXR: (from [facility])  Increased vascular congestion. Possibly small bilateral pleural effusions. Blurring of the left hemidiaphragm that may be  related to LLL infiltrate.   A:   - Acute COPD exacerbation - Acute hypoxemic respiratory failure P:   - D/C BiPAP. - Continue Solumedrol 60 mg IV q12 hrs - Rocephin / Zithromax IV - Sputum cultures, urine legionella and strep antigens  - Combivent q4 hrs - Albuterol PRN  CARDIOVASCULAR  Lab 11/21/12 1109 11/21/12 0545 11/21/12 0018  TROPONINI 1.12* 1.28* 1.36*  LATICACIDVEN -- -- 4.4*  PROBNP -- -- 18900.0*   ECG:  Sinus rhythm, RBBB, pathologic q waves on inferior leads, poor progression of R in V6-V6 Lines: Peripheral lines  A:  - Elevated BNP, elevated troponin and increased vascular congestion on chest X ray. History of chest pain but not active now. Pathologic q waves on inferior leads with no acute ST or T wave changes. Possibly recent MI with pulmonary edema.  P:  - Cath today. - Heparin drip per cards. - Continue aspirin - Amiodarone drip. - We will hold lasix since cath today. - Hold home antihypertensives for now. - Echocardiogram EF 30-35% with severe hypokineses.  RENAL  Lab 11/22/12 0640 11/21/12 0026  NA 127* 128*  K 2.9* 3.7  CL 86* 88*  CO2 29 25  BUN 32* 29*  CREATININE 0.93 0.94  CALCIUM 9.7 10.8*  MG -- 1.5  PHOS -- 3.8   Intake/Output      02/02 0701 - 02/03 0700 02/03 0701 - 02/04 0700   P.O. 240    I.V. (mL/kg) 1092.3 (18.3) 44.7 (0.8)   IV Piggyback 58    Total Intake(mL/kg) 1390.3 (23.3) 44.7 (0.8)  Urine (mL/kg/hr) 2625 (1.8)    Total Output 2625    Net -1234.7 +44.7         A:   - Normal kidney function P:   - Will repeat BMET in AM. - Replete K. - Mg and Phos in AM.  GASTROINTESTINAL  Lab 11/21/12 0026  AST 37  ALT 29  ALKPHOS 44  BILITOT 0.6  PROT 6.2  ALBUMIN 3.5   A:   - No issues P:   - GI prophylaxis with protonix - NPO for cath but will start diet after. - Transfer to SDU.  HEMATOLOGIC  Lab 11/22/12 0640 11/21/12 0545 11/21/12 0026  HGB 12.6 13.0 13.3  HCT 36.6 37.2 38.6  PLT 249 271 262  INR --  -- 1.05  APTT -- -- 110*   A:   - No issues P:  Will follow CBC  INFECTIOUS  Lab 11/22/12 0640 11/21/12 0545 11/21/12 0026  WBC 14.2* 10.2 14.5*  PROCALCITON -- -- --   Cultures: - Will F/U sputum cultures Antibiotics: - Rocephin - Zithromax  A:   - COPD exacerbation. Questionable LLL infiltrate P:   - Antibiotics as above.  ENDOCRINE No results found for this basename: GLUCAP:5 in the last 168 hours A:  - No issues   NEUROLOGIC  A:   - Awake and alert, non focal P:   - Pain management with PRN morphine  Alyson Reedy, M.D. [facility] Pulmonary/Critical Care Medicine. Pager: [phone]. After hours pager: [phone].

## 2012-11-22 NOTE — Plan of Care (Signed)
Problem: Phase I Progression Outcomes Goal: Voiding-avoid urinary catheter unless indicated Outcome: Not Progressing Patient has urinary catheter for monitoring urine output.

## 2012-11-22 NOTE — Progress Notes (Signed)
Previous foley catheter leaking.  Removed  Catheter.  Planning to leave catheter out and let pt use bsc,  Bedpan,    Assisted pt with bedpan.  Pt very sob with any exertion.  Pt receiving IV lasix.  Huddle done at desk with charge nurse and available staff.   Reinserted `14 french foley catheter using sterile technique.

## 2012-11-22 NOTE — Progress Notes (Signed)
Patient Name: Erica Howard Date of Encounter: 11/22/2012  Active Problems:  Atrial fibrillation with RVR  Abnormal EKG  NSTEMI (non-ST elevated myocardial infarction)  COPD (chronic obstructive pulmonary disease)  Hypertension  Hyponatremia  Hypokalemia    SUBJECTIVE: No more chest pain, pt with chronic DOE but doing OK without cigarettes. Anxious about cath.  OBJECTIVE Filed Vitals:   11/22/12 0500 11/22/12 0600 11/22/12 0800 11/22/12 0813  BP: 85/39 114/59    Pulse: 86 93    Temp:   98.2 F (36.8 C)   TempSrc:   Oral   Resp: 18 22    Height:      Weight: 131 lb 6.3 oz (59.6 kg) 131 lb 6.3 oz (59.6 kg)    SpO2: 100% 97%  98%    Intake/Output Summary (Last 24 hours) at 11/22/12 0855 Last data filed at 11/22/12 0800  Gross per 24 hour  Intake   1417 ml  Output   2625 ml  Net  -1208 ml   Filed Weights   11/21/12 0500 11/22/12 0500 11/22/12 0600  Weight: 139 lb 15.9 oz (63.5 kg) 131 lb 6.3 oz (59.6 kg) 131 lb 6.3 oz (59.6 kg)     PHYSICAL EXAM General: Well developed, well nourished, frail elderly female in no acute distress. Head: Normocephalic, atraumatic.  Neck: Supple without bruits, JVD at 8-10 cm. Lungs:  Resp regular and unlabored, rales and exp wheeze at times. Heart: Irregular R&R, S1, S2, no S3, S4, or murmur. Abdomen: Soft, non-tender, non-distended, BS + x 4.  Extremities: No clubbing, cyanosis, no edema.  Neuro: Alert and oriented X 3. Moves all extremities spontaneously. Psych: appears anxious  LABS: CBC:  Basename 11/22/12 0640 11/21/12 0545 11/21/12 0026  WBC 14.2* 10.2 --  NEUTROABS -- -- 13.5*  HGB 12.6 13.0 --  HCT 36.6 37.2 --  MCV 88.2 87.3 --  PLT 249 271 --   INR:  Basename 11/21/12 0026  INR 1.05   Basic Metabolic Panel:  Basename 11/22/12 0640 11/21/12 0026  NA 127* 128*  K 2.9* 3.7  CL 86* 88*  CO2 29 25  GLUCOSE 131* 170*  BUN 32* 29*  CREATININE 0.93 0.94  CALCIUM 9.7 10.8*  MG -- 1.5  PHOS -- 3.8   Liver  Function Tests:  Basename 11/21/12 0026  AST 37  ALT 29  ALKPHOS 44  BILITOT 0.6  PROT 6.2  ALBUMIN 3.5   Cardiac Enzymes:  Basename 11/21/12 1109 11/21/12 0545 11/21/12 0018  CKTOTAL -- -- --  CKMB -- -- --  CKMBINDEX -- -- --  TROPONINI 1.12* 1.28* 1.36*   No results found for this basename: TSH, T3TOTAL, T4TOTAL, THYROIDAB   BNP: Pro B Natriuretic peptide (BNP)  Date/Time Value Range Status  11/21/2012 12:18 AM 18900.0* 0 - 450 pg/mL Final   Fasting Lipid Panel:No results found for this basename: CHOL,HDL,LDLCALC,TRIG,CHOLHDL,LDLDIRECT in the last 72 hours  TELE:  Atrial fib, RVR at times    ECHO: 11/21/2012 Study Conclusions - Left ventricle: The cavity size was mildly dilated. Wall thickness was normal. Systolic function was moderately reduced. The estimated ejection fraction was in the range of 35% to 40%. Severe hypokinesis to akinesis of the apical myocardium and most of the inferior wall. - Aortic valve: Possibly bicuspid; mildly thickened, mildly calcified leaflets. Trivial regurgitation. - Pulmonary arteries: PA peak pressure: 48mm Hg (S).  Radiology/Studies: Portable Chest Xray 11/21/2012  *RADIOLOGY REPORT*  Clinical Data: Short of breath.  Frequent falls.  Weakness.  PORTABLE  CHEST - 1 VIEW  Comparison: None.  Findings: Mild cardiomegaly is seen as well as ectasia of the thoracic aorta.  Diffuse interstitial infiltrates are seen suspicious for mild interstitial edema.  No evidence of pulmonary air space disease or pleural effusion.  IMPRESSION: Mild cardiomegaly and diffuse interstitial infiltrates, suspicious for mild interstitial edema/congestive heart failure.   Original Report Authenticated By: [name], M.D.     Current Medications:     . albuterol-ipratropium  4 puff Inhalation Q4H  . antiseptic oral rinse  15 mL Mouth Rinse q12n4p  . aspirin  324 mg Oral Pre-Cath  . aspirin  81 mg Oral Daily  . azithromycin  500 mg Intravenous Q24H  . carvedilol   3.125 mg Oral BID WC  . cefTRIAXone (ROCEPHIN)  IV  1 g Intravenous QHS  . chlorhexidine  15 mL Mouth Rinse BID  . digoxin  0.125 mg Oral Daily  . furosemide  40 mg Intravenous BID  . methylPREDNISolone (SOLU-MEDROL) injection  60 mg Intravenous Q12H  . pantoprazole (PROTONIX) IV  40 mg Intravenous QHS  . polyethylene glycol  17 g Oral Daily  . potassium chloride  40 mEq Oral Once  . simvastatin  40 mg Oral q1800  . sodium chloride  3 mL Intravenous Q12H      . amiodarone (NEXTERONE PREMIX) 360 mg/200 mL dextrose 30.06 mg/hr (11/21/12 2153)  . heparin 800 Units/hr (11/21/12 2358)    ASSESSMENT AND PLAN: NSTEMI (non-ST elevated myocardial infarction) - for cath today, no ischemic symptoms on current meds. Added PRN Xanax for anxiety, can d/c after cath at IM discretion. Has LVD on echo. Will check lipid profile and add statin with NSTEMI. She is on ASA/BB. See JH/DM notes, possible Takutsubo.  AFib, RVR - on BB and amio drip, LFTs OK. Will check TSH.  Active Problems:  Abnormal EKG  COPD (chronic obstructive pulmonary disease)  Hypertension  Hyponatremia  Hypokalemia   Signed, Theodore Demark , PA-C 8:55 AM 11/22/2012   History and all data above reviewed.  Patient examined.  I agree with the findings as above.  Patient with heart failure in addition to her acute on chronic lung disease.  Cardiomyopathy on echo.  Probable CAD.  Cath today. She says that she is breathing somewhat better than she was on admission. The patient exam reveals COR:RRR  ,  Lungs: Coarse rhonchi  ,  Abd: Positive bowel sounds, no rebound no guarding, Ext No edema  .  All available labs, radiology testing, previous records reviewed. Agree with documented assessment and plan. Cath today.  Further management pending this results.  She continues with pulmonary therapy per CCM  Atrial fib rate controlled. I will resume heparin after cath.   Fayrene Fearing Lacole Komorowski  12:01 PM  11/22/2012

## 2012-11-22 NOTE — H&P (View-Only) (Signed)
 Patient Name: Erica Howard Date of Encounter: 11/22/2012  Active Problems:  Atrial fibrillation with RVR  Abnormal EKG  NSTEMI (non-ST elevated myocardial infarction)  COPD (chronic obstructive pulmonary disease)  Hypertension  Hyponatremia  Hypokalemia    SUBJECTIVE: No more chest pain, pt with chronic DOE but doing OK without cigarettes. Anxious about cath.  OBJECTIVE Filed Vitals:   11/22/12 0500 11/22/12 0600 11/22/12 0800 11/22/12 0813  BP: 85/39 114/59    Pulse: 86 93    Temp:   98.2 F (36.8 C)   TempSrc:   Oral   Resp: 18 22    Height:      Weight: 131 lb 6.3 oz (59.6 kg) 131 lb 6.3 oz (59.6 kg)    SpO2: 100% 97%  98%    Intake/Output Summary (Last 24 hours) at 11/22/12 0855 Last data filed at 11/22/12 0800  Gross per 24 hour  Intake   1417 ml  Output   2625 ml  Net  -1208 ml   Filed Weights   11/21/12 0500 11/22/12 0500 11/22/12 0600  Weight: 139 lb 15.9 oz (63.5 kg) 131 lb 6.3 oz (59.6 kg) 131 lb 6.3 oz (59.6 kg)     PHYSICAL EXAM General: Well developed, well nourished, frail elderly female in no acute distress. Head: Normocephalic, atraumatic.  Neck: Supple without bruits, JVD at 8-10 cm. Lungs:  Resp regular and unlabored, rales and exp wheeze at times. Heart: Irregular R&R, S1, S2, no S3, S4, or murmur. Abdomen: Soft, non-tender, non-distended, BS + x 4.  Extremities: No clubbing, cyanosis, no edema.  Neuro: Alert and oriented X 3. Moves all extremities spontaneously. Psych: appears anxious  LABS: CBC:  Basename 11/22/12 0640 11/21/12 0545 11/21/12 0026  WBC 14.2* 10.2 --  NEUTROABS -- -- 13.5*  HGB 12.6 13.0 --  HCT 36.6 37.2 --  MCV 88.2 87.3 --  PLT 249 271 --   INR:  Basename 11/21/12 0026  INR 1.05   Basic Metabolic Panel:  Basename 11/22/12 0640 11/21/12 0026  NA 127* 128*  K 2.9* 3.7  CL 86* 88*  CO2 29 25  GLUCOSE 131* 170*  BUN 32* 29*  CREATININE 0.93 0.94  CALCIUM 9.7 10.8*  MG -- 1.5  PHOS -- 3.8   Liver  Function Tests:  Basename 11/21/12 0026  AST 37  ALT 29  ALKPHOS 44  BILITOT 0.6  PROT 6.2  ALBUMIN 3.5   Cardiac Enzymes:  Basename 11/21/12 1109 11/21/12 0545 11/21/12 0018  CKTOTAL -- -- --  CKMB -- -- --  CKMBINDEX -- -- --  TROPONINI 1.12* 1.28* 1.36*   No results found for this basename: TSH, T3TOTAL, T4TOTAL, THYROIDAB   BNP: Pro B Natriuretic peptide (BNP)  Date/Time Value Range Status  11/21/2012 12:18 AM 18900.0* 0 - 450 pg/mL Final   Fasting Lipid Panel:No results found for this basename: CHOL,HDL,LDLCALC,TRIG,CHOLHDL,LDLDIRECT in the last 72 hours  TELE:  Atrial fib, RVR at times    ECHO: 11/21/2012 Study Conclusions - Left ventricle: The cavity size was mildly dilated. Wall thickness was normal. Systolic function was moderately reduced. The estimated ejection fraction was in the range of 35% to 40%. Severe hypokinesis to akinesis of the apical myocardium and most of the inferior wall. - Aortic valve: Possibly bicuspid; mildly thickened, mildly calcified leaflets. Trivial regurgitation. - Pulmonary arteries: PA peak pressure: 48mm Hg (S).  Radiology/Studies: Portable Chest Xray 11/21/2012  *RADIOLOGY REPORT*  Clinical Data: Short of breath.  Frequent falls.  Weakness.  PORTABLE   CHEST - 1 VIEW  Comparison: None.  Findings: Mild cardiomegaly is seen as well as ectasia of the thoracic aorta.  Diffuse interstitial infiltrates are seen suspicious for mild interstitial edema.  No evidence of pulmonary air space disease or pleural effusion.  IMPRESSION: Mild cardiomegaly and diffuse interstitial infiltrates, suspicious for mild interstitial edema/congestive heart failure.   Original Report Authenticated By: John Stahl, M.D.     Current Medications:     . albuterol-ipratropium  4 puff Inhalation Q4H  . antiseptic oral rinse  15 mL Mouth Rinse q12n4p  . aspirin  324 mg Oral Pre-Cath  . aspirin  81 mg Oral Daily  . azithromycin  500 mg Intravenous Q24H  . carvedilol   3.125 mg Oral BID WC  . cefTRIAXone (ROCEPHIN)  IV  1 g Intravenous QHS  . chlorhexidine  15 mL Mouth Rinse BID  . digoxin  0.125 mg Oral Daily  . furosemide  40 mg Intravenous BID  . methylPREDNISolone (SOLU-MEDROL) injection  60 mg Intravenous Q12H  . pantoprazole (PROTONIX) IV  40 mg Intravenous QHS  . polyethylene glycol  17 g Oral Daily  . potassium chloride  40 mEq Oral Once  . simvastatin  40 mg Oral q1800  . sodium chloride  3 mL Intravenous Q12H      . amiodarone (NEXTERONE PREMIX) 360 mg/200 mL dextrose 30.06 mg/hr (11/21/12 2153)  . heparin 800 Units/hr (11/21/12 2358)    ASSESSMENT AND PLAN: NSTEMI (non-ST elevated myocardial infarction) - for cath today, no ischemic symptoms on current meds. Added PRN Xanax for anxiety, can d/c after cath at IM discretion. Has LVD on echo. Will check lipid profile and add statin with NSTEMI. She is on ASA/BB. See JH/DM notes, possible Takutsubo.  AFib, RVR - on BB and amio drip, LFTs OK. Will check TSH.  Active Problems:  Abnormal EKG  COPD (chronic obstructive pulmonary disease)  Hypertension  Hyponatremia  Hypokalemia   Signed, Rhonda Barrett , PA-C 8:55 AM 11/22/2012   History and all data above reviewed.  Patient examined.  I agree with the findings as above.  Patient with heart failure in addition to her acute on chronic lung disease.  Cardiomyopathy on echo.  Probable CAD.  Cath today. She says that she is breathing somewhat better than she was on admission. The patient exam reveals COR:RRR  ,  Lungs: Coarse rhonchi  ,  Abd: Positive bowel sounds, no rebound no guarding, Ext No edema  .  All available labs, radiology testing, previous records reviewed. Agree with documented assessment and plan. Cath today.  Further management pending this results.  She continues with pulmonary therapy per CCM  Atrial fib rate controlled. I will resume heparin after cath.   Chester Sibert  12:01 PM  11/22/2012  

## 2012-11-22 NOTE — CV Procedure (Signed)
  Cardiac Catheterization Procedure Note  Name: Erica Howard MRN: [id number] DOB: [birth year]  Procedure: Left Heart Cath, Selective Coronary Angiography, LV angiography  Indication:  Cardiomyopathy, NQWMI  Procedural details: The right groin was prepped, draped, and anesthetized with 1% lidocaine. Using modified Seldinger technique, a 5 French sheath was introduced into the right femoral artery. Standard Judkins catheters were used for coronary angiography and left ventriculography. Catheter exchanges were performed over a guidewire. There were no immediate procedural complications. The patient was transferred to the post catheterization recovery area for further monitoring.  I was unable to advance the guidewire up the right iliac artery.  Therefore, I changed to a right radial approach.  This was prepped as above.  There were no complications with this procedure.    Procedural Findings:   Hemodynamics:     AO 99/55     LV 99/13   Coronary angiography:   Coronary dominance: Right  Left mainstem:   Normal.    Left anterior descending (LAD):   Moderate proximal calcification.  Proximal and mid diffuse luminal irregularities.  Proximal D1 is large and has luminal irregularities.  D2 is small with ostial 30% stenosis.  D3 is moderate and normal.    Left circumflex (LCx):  AV groove with proximal focal 30 - 40% stenosis leading into a large OM.  The OM has mild diffuse luminal irregularities.    Ramus intermedius (RI):  Large and normal.  Right coronary artery (RCA):  Large and dominant.  Mid moderate calcification.  Mid long 25% stenosis.  PDA moderate sized and normal.  PL x 2 small to moderate sized and normal.    Left ventriculography: Left ventricular systolic function is reduced with anteroapical, inferoapical and apical hypokinesis, LVEF is estimated at 40%, there is no significant mitral regurgitation   Final Conclusions:  Mild non obstructive CAD.  Moderate LV dysfunction  consistent with Takatsubo's cardiomyopathy  Recommendations: Medical management.    [name] 11/22/2012, 1:21 PM

## 2012-11-22 NOTE — Progress Notes (Signed)
ANTICOAGULATION CONSULT NOTE - Follow Up Consult  Pharmacy Consult for heparin Indication: chest pain/ACS  Labs:  Basename 11/22/12 0640 11/21/12 1331 11/21/12 1109 11/21/12 0545 11/21/12 0026 11/21/12 0018  HGB 12.6 -- -- 13.0 -- --  HCT 36.6 -- -- 37.2 38.6 --  PLT 249 -- -- 271 262 --  APTT -- -- -- -- 110* --  LABPROT -- -- -- -- 13.6 --  INR -- -- -- -- 1.05 --  HEPARINUNFRC 0.19* 0.33 -- 0.59 -- --  CREATININE -- -- -- -- 0.94 --  CKTOTAL -- -- -- -- -- --  CKMB -- -- -- -- -- --  TROPONINI -- -- 1.12* 1.28* -- 1.36*    Assessment: 79yo female now subtherapeutic on heparin after two levels at goal though had been trending down.  Goal of Therapy:  Heparin level 0.3-0.7 units/ml   Plan:  Will increase heparin gtt by 2 units/kg/hr to 900 units/hr and f/u after cath.  Colleen Can PharmD BCPS 11/22/2012,7:40 AM

## 2012-11-23 DIAGNOSIS — I5181 Takotsubo syndrome: Secondary | ICD-10-CM | POA: Diagnosis present

## 2012-11-23 LAB — MAGNESIUM: Magnesium: 1.6 mg/dL (ref 1.5–2.5)

## 2012-11-23 LAB — CBC
Platelets: 241 10*3/uL (ref 150–400)
RBC: 4.33 MIL/uL (ref 3.87–5.11)
WBC: 10.5 10*3/uL (ref 4.0–10.5)

## 2012-11-23 LAB — BASIC METABOLIC PANEL
CO2: 33 mEq/L — ABNORMAL HIGH (ref 19–32)
Chloride: 90 mEq/L — ABNORMAL LOW (ref 96–112)
Sodium: 131 mEq/L — ABNORMAL LOW (ref 135–145)

## 2012-11-23 LAB — PHOSPHORUS: Phosphorus: 3.5 mg/dL (ref 2.3–4.6)

## 2012-11-23 MED ORDER — ENALAPRIL MALEATE 10 MG PO TABS
10.0000 mg | ORAL_TABLET | Freq: Every day | ORAL | Status: DC
  Administered 2012-11-24 – 2012-11-26 (×3): 10 mg via ORAL
  Filled 2012-11-23 (×4): qty 1

## 2012-11-23 MED ORDER — MAGNESIUM SULFATE 40 MG/ML IJ SOLN
2.0000 g | Freq: Once | INTRAMUSCULAR | Status: AC
  Administered 2012-11-23: 2 g via INTRAVENOUS
  Filled 2012-11-23: qty 50

## 2012-11-23 MED ORDER — PANTOPRAZOLE SODIUM 40 MG PO TBEC
40.0000 mg | DELAYED_RELEASE_TABLET | Freq: Every day | ORAL | Status: DC
  Administered 2012-11-23 – 2012-11-25 (×3): 40 mg via ORAL
  Filled 2012-11-23 (×4): qty 1

## 2012-11-23 NOTE — Progress Notes (Signed)
Pt transferred to room 4707 from unit 2900. Report received from [place], [place]. Pt A&O x4. PIV intact. Introduced self to pt and placed call bell in reach. Pt oriented to room and unit. Assessment completed. Will continue to monitor pt closely.  Juliane Lack, RN

## 2012-11-23 NOTE — Progress Notes (Signed)
Name: Erica Howard MRN: [id number] DOB: July 31, 1927    LOS: 3  PULMONARY / CRITICAL CARE MEDICINE  HPI:   79 years old female with PMH relevant for non oxygen dependent COPD, HTN, PAD and OA on chronic narcotics. She is a current smoker of 1 1/2 to 2 packs per day. On combivent at home. Recently diagnosed with poison ivy related skin rash and started on prednisone taper 3 days ago. For the last two weeks with intermittent chest pain, unclear are the characteristics and duration of pain and if it is exercise induced since the patient is on BiPAP and is poor historian. For the last two weeks she has had progressive worsening of wheezing and SOB. Her chronic cough and sputum production are stable. Denies fever or chills. Admitted initially to [facility] ED where she was found in moderate to severe respiratory distress. On exam she had crackles, wheezing and JVD. She was given lasix, NTG patch, metoprolol, IV steroids and respiratory treatments with good response. Initial ABG showed a pH: 7.38, PCO2: 37, PO2 64, Bicarb: 21 and O2 sat 94% on RA. She had an elevated troponin of 1.67, a BNP of 1100, elevated WBC of 15.3, normal kidney function, normal Hgb. Her chest X ray showed increased vascular congestion, possibly small bilateral pleural effusions, blurring of the left hemidiaphragm tha may be related to e LLL consolidation vs effusion. At the time of my exam the patient is doing significantly better as per her daughter at bedside. The patient is awake, alert, oriented, slightly hypotensive (84/67), HR of 106,  breathing comfortably on BiPAP, saturating 98% on 30% FiO2.  Active problems - Acute COPD exacerbation - Questionable non STEMI - Questionable acute pulmonary edema - Acute hypoxemic respiratory failure  ASSESSMENT AND PLAN  PULMONARY CXR: (from [facility])  Increased vascular congestion. Possibly small bilateral pleural effusions. Blurring of the left hemidiaphragm that may be  related to LLL infiltrate.   A:   - Acute COPD exacerbation - Acute hypoxemic respiratory failure P:   - Continue Solumedrol 60 mg IV q12 hrs. - Rocephin / Zithromax IV pending cultures. - Sputum cultures, urine legionella and strep antigens  - Combivent q4 hrs - Albuterol PRN  CARDIOVASCULAR  Lab 11/21/12 1109 11/21/12 0545 11/21/12 0018  TROPONINI 1.12* 1.28* 1.36*  LATICACIDVEN -- -- 4.4*  PROBNP -- -- 18900.0*   ECG:  Sinus rhythm, RBBB, pathologic q waves on inferior leads, poor progression of R in V6-V6 Lines: Peripheral lines  A:  - Elevated BNP, elevated troponin and increased vascular congestion on chest X ray. History of chest pain but not active now. Pathologic q waves on inferior leads with no acute ST or T wave changes. Possibly recent MI with pulmonary edema.  Cath with non-obstructive CAD.  Recommendations are medical managemet. P:  - Heparin drip per cards. - D/C aspirin - D/C Amiodarone drip. - Lasix as ordered. - Hold home antihypertensives for now. - Echocardiogram EF 30-35% with severe hypokineses.  RENAL  Lab 11/23/12 0514 11/22/12 0640 11/21/12 0026  NA 131* 127* 128*  K 3.7 2.9* --  CL 90* 86* 88*  CO2 33* 29 25  BUN 34* 32* 29*  CREATININE 0.96 0.93 0.94  CALCIUM 9.6 9.7 10.8*  MG 1.6 -- 1.5  PHOS 3.5 -- 3.8   Intake/Output      02/03 0701 - 02/04 0700 02/04 0701 - 02/05 0700   P.O. 480    I.V. (mL/kg) 956.7 (16.1)    IV Piggyback  304 4   Total Intake(mL/kg) 1740.7 (29.2) 4 (0.1)   Urine (mL/kg/hr) 2415 (1.7) 200   Total Output 2415 200   Net -674.3 -196        Urine Occurrence 1 x    Stool Occurrence 3 x     A:   - Normal kidney function P:   - Will repeat BMET in AM. - Replete K. - Mg replacement.  GASTROINTESTINAL  Lab 11/21/12 0026  AST 37  ALT 29  ALKPHOS 44  BILITOT 0.6  PROT 6.2  ALBUMIN 3.5   A:   - No issues P:   - GI prophylaxis with protonix - Heart healthy diet. - Transfer to  Tele.  HEMATOLOGIC  Lab 11/23/12 0514 11/22/12 0640 11/21/12 0545 11/21/12 0026  HGB 13.2 12.6 13.0 13.3  HCT 38.1 36.6 37.2 38.6  PLT 241 249 271 262  INR -- -- -- 1.05  APTT -- -- -- 110*   A:   - No issues P:  Will follow CBC.  INFECTIOUS  Lab 11/23/12 0514 11/22/12 0640 11/21/12 0545 11/21/12 0026  WBC 10.5 14.2* 10.2 14.5*  PROCALCITON -- -- -- --   Cultures: - Will F/U sputum cultures Antibiotics: - Rocephin. - Zithromax.  A:   - COPD exacerbation. Questionable LLL infiltrate P:   - Antibiotics as above.  ENDOCRINE No results found for this basename: GLUCAP:5 in the last 168 hours A:  - No issues  NEUROLOGIC A:   - Awake and alert, non focal P:   - Pain management with PRN morphine  Will transfer to tele and to [facility] service for 2/5.  Alyson Reedy, M.D. [facility] Pulmonary/Critical Care Medicine. Pager: [phone]. After hours pager: [phone].

## 2012-11-23 NOTE — Progress Notes (Signed)
Nursing: At 0530 14 french urinary catheter removed after internal balloon deflated. 10 mL of clear solution removed. Pt tolerated procedure well. Peri care was provided. Pt instructed to call nurse when needed to void. Pt verbalized understanding. Pt assisted out of bed and currently sitting in bedside chair. All was observed with Virgel Manifold, Registered Nurse present.

## 2012-11-23 NOTE — Progress Notes (Signed)
Patient Name: Erica Howard Date of Encounter: 11/23/2012   Active Problems:  Abnormal EKG  NSTEMI (non-ST elevated myocardial infarction)  COPD (chronic obstructive pulmonary disease)  Hypertension  Hyponatremia  Hypokalemia  Atrial fibrillation with RVR    SUBJECTIVE  No acute events overnight.  Denies chest pain.  Breathing improved with supplemental O2 (on 3L [address])  CURRENT MEDS    . albuterol-ipratropium  4 puff Inhalation Q4H  . aspirin  81 mg Oral Daily  . azithromycin  500 mg Intravenous Q24H  . carvedilol  3.125 mg Oral BID WC  . cefTRIAXone (ROCEPHIN)  IV  1 g Intravenous QHS  . digoxin  0.125 mg Oral Daily  . furosemide  40 mg Intravenous BID  . heparin subcutaneous  5,000 Units Subcutaneous Q8H  . methylPREDNISolone (SOLU-MEDROL) injection  60 mg Intravenous Q12H  . pantoprazole (PROTONIX) IV  40 mg Intravenous QHS  . polyethylene glycol  17 g Oral Daily  . simvastatin  40 mg Oral q1800    OBJECTIVE  Filed Vitals:   11/23/12 0312 11/23/12 0400 11/23/12 0500 11/23/12 0530  BP:  93/37 97/47 104/47  Pulse:  87 85 88  Temp:      TempSrc:      Resp:  25 18 23   Height:      Weight:      SpO2: 100% 99% 100% 98%    Intake/Output Summary (Last 24 hours) at 11/23/12 0653 Last data filed at 11/23/12 0600  Gross per 24 hour  Intake 1785.4 ml  Output   2415 ml  Net -629.6 ml   Filed Weights   11/21/12 0500 11/22/12 0500 11/22/12 0600  Weight: 139 lb 15.9 oz (63.5 kg) 131 lb 6.3 oz (59.6 kg) 131 lb 6.3 oz (59.6 kg)    PHYSICAL EXAM  General: Pleasant, NAD. Neuro: Alert and oriented X 3. Moves all extremities spontaneously. Psych: Normal affect. HEENT:  Normal  Neck: Supple without bruits or JVD. Lungs:  Soft b/l expiratory wheezing Heart: RRR no s3, s4, or murmurs. Abdomen: Soft, non-tender, non-distended, BS + x 4.  Extremities: No clubbing, cyanosis or edema. DP/PT/Radials 2+ and equal bilaterally.  Accessory Clinical  Findings  CBC  Basename 11/23/12 0514 11/22/12 0640 11/21/12 0026  WBC 10.5 14.2* --  NEUTROABS -- -- 13.5*  HGB 13.2 12.6 --  HCT 38.1 36.6 --  MCV 88.0 88.2 --  PLT 241 249 --   Basic Metabolic Panel  Basename 11/23/12 0514 11/22/12 0640 11/21/12 0026  NA 131* 127* --  K 3.7 2.9* --  CL 90* 86* --  CO2 33* 29 --  GLUCOSE 154* 131* --  BUN 34* 32* --  CREATININE 0.96 0.93 --  CALCIUM 9.6 9.7 --  MG 1.6 -- 1.5  PHOS 3.5 -- 3.8   Liver Function Tests  Basename 11/21/12 0026  AST 37  ALT 29  ALKPHOS 44  BILITOT 0.6  PROT 6.2  ALBUMIN 3.5   No results found for this basename: LIPASE:2,AMYLASE:2 in the last 72 hours Cardiac Enzymes  Basename 11/21/12 1109 11/21/12 0545 11/21/12 0018  CKTOTAL -- -- --  CKMB -- -- --  CKMBINDEX -- -- --  TROPONINI 1.12* 1.28* 1.36*   BNP No components found with this basename: POCBNP:3 D-Dimer No results found for this basename: DDIMER:2 in the last 72 hours Hemoglobin A1C No results found for this basename: HGBA1C in the last 72 hours Fasting Lipid Panel  Basename 11/22/12 0640  CHOL 118  HDL 58  LDLCALC 44  TRIG 79  CHOLHDL 2.0  LDLDIRECT --   Thyroid Function Tests  Basename 11/22/12 0858  TSH 1.117  T4TOTAL --  T3FREE --  THYROIDAB --     Radiology/Studies  Portable Chest Xray  11/21/2012  *RADIOLOGY REPORT*  Clinical Data: Short of breath.  Frequent falls.  Weakness.  PORTABLE CHEST - 1 VIEW  Comparison: None.  Findings: Mild cardiomegaly is seen as well as ectasia of the thoracic aorta.  Diffuse interstitial infiltrates are seen suspicious for mild interstitial edema.  No evidence of pulmonary air space disease or pleural effusion.  IMPRESSION: Mild cardiomegaly and diffuse interstitial infiltrates, suspicious for mild interstitial edema/congestive heart failure.   Original Report Authenticated By: [name], M.D.     ASSESSMENT AND PLAN 79 years old female with PMH relevant for non oxygen dependent COPD,  HTN, PAD and OA on chronic narcotics admitted on 11/22/12 in moderate to severe respiratory distress  #NSTEMI: s/p cath (mild stenosis in LAD, LCx, RCA, with EF 40%-- mild non obstructive CAD with moderate LV dysfunction consistent with Takatsubo's cardiomyopathy in setting of acute COPD exacerbation) with plans for medical mgmt (no stenting);  -Continue ASA, BB, Statin (LDL <100)  #Afib: TSH wnl; patient currently in sinus rhythm; amio discontinued on 11/22/12 -Continue BB (coreg 3.125 bid) and digoxin (started on 11/22/12 - may consider d/c soon in setting of improved respiratory function)  #Systolic Heart failure: echo on 11/21/12 with EF 35-40% and EF 40% on cath -Continue supportive mgmt in setting of takatsubo picture; cont BB & diuretics (diuretic dosing per primary, currently on lasix 40 IV BID, may consider transitioning to PO; of note, patient was lasix naive prior to admission) -Reasonable to consider repeat echo (limited without contrast) prior to discharge -Restart home enalapril 10mg  daily (holding HCTZ since receiving lasix) -Continue to hold home verapamil - d/c at discharge in setting of heart failure  #Acute COPD exacerbation: mgmt per primary with solumedrol, rocephin/zithromax, combivent and albuterol; still requiring supplemental O2  #Dispo: PCCM to transfer to tele under [facility] for 11/24/12  Signed, Stacy Gardner MD Patient seen and examined. I agree with the assessment and plan as detailed above. See also my additional thoughts below. I have reviewed all the information with Dr. Everardo Beals.    NSTEMI (non-ST elevated myocardial infarction).................plan  medical therapy   COPD (chronic obstructive pulmonary disease)  Hypertension  Hyponatremia  Hypokalemia   Atrial fibrillation with RVR     The patient has converted to sinus rhythm. Continue to watch the rhythm.   Takotsubo syndrome      The patient is receiving appropriate medications for left ventricular dysfunction.  Verapamil that was being used as an outpatient has been stopped. It would be very reasonable to consider a limited echo to really look at left ventricular function before the patient goes home.   Willa Rough, MD, [facility] 11/23/2012 11:03 AM

## 2012-11-24 DIAGNOSIS — I5181 Takotsubo syndrome: Secondary | ICD-10-CM

## 2012-11-24 LAB — BASIC METABOLIC PANEL
BUN: 37 mg/dL — ABNORMAL HIGH (ref 6–23)
Chloride: 90 mEq/L — ABNORMAL LOW (ref 96–112)
Creatinine, Ser: 0.99 mg/dL (ref 0.50–1.10)
Glucose, Bld: 118 mg/dL — ABNORMAL HIGH (ref 70–99)
Potassium: 3 mEq/L — ABNORMAL LOW (ref 3.5–5.1)

## 2012-11-24 LAB — CBC
HCT: 40.3 % (ref 36.0–46.0)
Hemoglobin: 13.9 g/dL (ref 12.0–15.0)
MCV: 88.6 fL (ref 78.0–100.0)
RDW: 13.4 % (ref 11.5–15.5)
WBC: 13.8 10*3/uL — ABNORMAL HIGH (ref 4.0–10.5)

## 2012-11-24 MED ORDER — FUROSEMIDE 40 MG PO TABS
40.0000 mg | ORAL_TABLET | Freq: Two times a day (BID) | ORAL | Status: DC
  Administered 2012-11-24 (×2): 40 mg via ORAL
  Filled 2012-11-24 (×7): qty 1

## 2012-11-24 MED ORDER — POTASSIUM CHLORIDE 10 MEQ/100ML IV SOLN
10.0000 meq | INTRAVENOUS | Status: AC
  Administered 2012-11-24 (×2): 10 meq via INTRAVENOUS
  Filled 2012-11-24 (×2): qty 100

## 2012-11-24 MED ORDER — AZITHROMYCIN 500 MG PO TABS
500.0000 mg | ORAL_TABLET | ORAL | Status: DC
  Administered 2012-11-24 – 2012-11-25 (×2): 500 mg via ORAL
  Filled 2012-11-24 (×3): qty 1

## 2012-11-24 MED ORDER — PREDNISONE 50 MG PO TABS
60.0000 mg | ORAL_TABLET | Freq: Every day | ORAL | Status: DC
  Administered 2012-11-25 – 2012-11-26 (×2): 60 mg via ORAL
  Filled 2012-11-24 (×3): qty 1

## 2012-11-24 NOTE — Progress Notes (Signed)
[facility] PROGRESS NOTE  Erica Howard GNF:[id number] DOB: 1927/05/09 DOA: 11/20/2012 PCP: Peggye Pitt, MD  Assessment/Plan: 1. Acute COPD exacerbation with LLL infiltrate  On IV solumedrol , will change to po prednisone today. Nebs, oxygen and iv antibiotics to complete the course. Will start tapering steroids today.   2. NSTEMI/ Cardiomyopathy : S/p cardiac cath with non obstructive CAD. On lasix and digoxin.   3. Paroxysmal Atrial fibrillation: rate better controlled.  4. Hypokalemia: will be repleted as needed.  Magnesium level normal. 5. Leukocytosis probably from steroids. She is afebrile and non toxic looking.  DVT prophylaxis    Code Status: full code Family Communication: none at bedside Disposition Plan: 1 to 2 days.    Consultants:  PCCM signed off on 2/4  cardiology  HPI/Subjective: No new complaints.   Objective: Filed Vitals:   11/24/12 0516 11/24/12 0730 11/24/12 0828 11/24/12 0859  BP: 124/73  147/59 147/76  Pulse: 98  95 95  Temp: 98.1 F (36.7 C)     TempSrc: Oral  Oral   Resp: 20     Height:      Weight: 58.4 kg (128 lb 12 oz)     SpO2: 98% 96%      Intake/Output Summary (Last 24 hours) at 11/24/12 0946 Last data filed at 11/24/12 0930  Gross per 24 hour  Intake    830 ml  Output    900 ml  Net    -70 ml   Filed Weights   11/22/12 0600 11/23/12 1104 11/24/12 0516  Weight: 59.6 kg (131 lb 6.3 oz) 58.8 kg (129 lb 10.1 oz) 58.4 kg (128 lb 12 oz)    Exam:  General: Pleasant, NAD.  Neuro: Alert and oriented X 3. Moves all extremities.  Neck: Supple without bruits or JVD.  Lungs:  CTAB, no wheezing.  Heart: RRR no s3, s4, or murmurs.  Abdomen: Soft, non-tender, non-distended, BS + x 4.  Extremities: No clubbing, cyanosis or edema. DP/PT/Radials 2+ and equal bilaterally.  Data Reviewed: Basic Metabolic Panel:  Lab 11/24/12 8469 11/23/12 0514 11/22/12 0640 11/21/12 0026  NA 136 131* 127* 128*  K 3.0* 3.7 2.9* 3.7  CL 90*  90* 86* 88*  CO2 36* 33* 29 25  GLUCOSE 118* 154* 131* 170*  BUN 37* 34* 32* 29*  CREATININE 0.99 0.96 0.93 0.94  CALCIUM 10.0 9.6 9.7 10.8*  MG 2.1 1.6 -- 1.5  PHOS 2.8 3.5 -- 3.8   Liver Function Tests:  Lab 11/21/12 0026  AST 37  ALT 29  ALKPHOS 44  BILITOT 0.6  PROT 6.2  ALBUMIN 3.5   No results found for this basename: LIPASE:5,AMYLASE:5 in the last 168 hours No results found for this basename: AMMONIA:5 in the last 168 hours CBC:  Lab 11/24/12 0525 11/23/12 0514 11/22/12 0640 11/21/12 0545 11/21/12 0026  WBC 13.8* 10.5 14.2* 10.2 14.5*  NEUTROABS -- -- -- -- 13.5*  HGB 13.9 13.2 12.6 13.0 13.3  HCT 40.3 38.1 36.6 37.2 38.6  MCV 88.6 88.0 88.2 87.3 88.5  PLT 274 241 249 271 262   Cardiac Enzymes:  Lab 11/21/12 1109 11/21/12 0545 11/21/12 0018  CKTOTAL -- -- --  CKMB -- -- --  CKMBINDEX -- -- --  TROPONINI 1.12* 1.28* 1.36*   BNP (last 3 results)  Basename 11/21/12 0018  PROBNP 18900.0*   CBG: No results found for this basename: GLUCAP:5 in the last 168 hours  Recent Results (from the past 240 hour(s))  MRSA PCR  SCREENING     Status: Normal   Collection Time   11/21/12 12:10 AM      Component Value Range Status Comment   MRSA by PCR NEGATIVE  NEGATIVE Final      Studies: No results found.  Scheduled Meds:   . albuterol-ipratropium  4 puff Inhalation Q4H  . aspirin  81 mg Oral Daily  . azithromycin  500 mg Oral Q24H  . carvedilol  3.125 mg Oral BID WC  . cefTRIAXone (ROCEPHIN)  IV  1 g Intravenous QHS  . digoxin  0.125 mg Oral Daily  . enalapril  10 mg Oral Daily  . furosemide  40 mg Oral BID  . heparin subcutaneous  5,000 Units Subcutaneous Q8H  . methylPREDNISolone (SOLU-MEDROL) injection  60 mg Intravenous Q12H  . pantoprazole  40 mg Oral Q1200  . polyethylene glycol  17 g Oral Daily  . potassium chloride  10 mEq Intravenous Q1 Hr x 2  . simvastatin  40 mg Oral q1800   Continuous Infusions:   Active Problems:  NSTEMI (non-ST elevated  myocardial infarction)  COPD (chronic obstructive pulmonary disease)  Hypertension  Hyponatremia  Hypokalemia  Atrial fibrillation with RVR  Takotsubo syndrome        Erica Howard  [facility] Pager [phone]. If 8PM-8AM, please contact night-coverage at [email or web address], password [facility] 11/24/2012, 9:46 AM  LOS: 4 days

## 2012-11-24 NOTE — Evaluation (Signed)
Physical Therapy Evaluation Patient Details Name: Erica Howard MRN: [id number] DOB: 02-Feb-1927 Today's Date: 11/24/2012 Time: 2956-2130 PT Time Calculation (min): 23 min  PT Assessment / Plan / Recommendation Clinical Impression  79 y/o female adm. for COPD exacerbation and nstemi/cardiomyopathy. Presents to PT today with below impairments affecting PLOF and independence. Will benefit physical therapy in the acute setting to maximize strength and independence for safe d/c home with the support of her family.     PT Assessment  Patient needs continued PT services    Follow Up Recommendations  Home health PT;Supervision for mobility/OOB    Does the patient have the potential to tolerate intense rehabilitation      Barriers to Discharge        Equipment Recommendations  Rolling walker with 5" wheels    Recommendations for Other Services     Frequency Min 3X/week    Precautions / Restrictions Precautions Precautions: Fall Restrictions Weight Bearing Restrictions: No   Pertinent Vitals/Pain C/o upper right burning back pain that started yesterday, RN aware      Mobility  Bed Mobility Bed Mobility: Supine to Sit Supine to Sit: 5: Supervision;With rails;HOB elevated (30 degrees) Details for Bed Mobility Assistance: slow transition due to weakness, supervision for safety Transfers Transfers: Sit to Stand;Stand to Sit;Stand Pivot Transfers Sit to Stand: 4: Min assist;With upper extremity assist;From bed;From chair/3-in-1 Stand to Sit: 4: Min guard;With upper extremity assist;To chair/3-in-1 Stand Pivot Transfers: 4: Min assist Details for Transfer Assistance: cues for safe hand placement and minA stability assist especially during transfer Ambulation/Gait Ambulation/Gait Assistance: 4: Min guard Ambulation Distance (Feet): 20 Feet Assistive device: Rolling walker Ambulation/Gait Assistance Details: cues for tall posture and safe positioning within RW; gaurding for  stability General Gait Details: weak and flexed gait with decreased speed, decreased step height and stride length Stairs: No         PT Diagnosis: Difficulty walking;Abnormality of gait;Generalized weakness  PT Problem List: Decreased strength;Decreased activity tolerance;Decreased balance;Decreased mobility;Decreased knowledge of use of DME PT Treatment Interventions: DME instruction;Gait training;Functional mobility training;Therapeutic activities;Therapeutic exercise;Balance training;Neuromuscular re-education;Patient/family education   PT Goals Acute Rehab PT Goals PT Goal Formulation: With patient Time For Goal Achievement: 12/01/12 Potential to Achieve Goals: Good Pt will go Supine/Side to Sit: with modified independence PT Goal: Supine/Side to Sit - Progress: Goal set today Pt will go Sit to Supine/Side: with modified independence PT Goal: Sit to Supine/Side - Progress: Goal set today Pt will go Sit to Stand: with modified independence PT Goal: Sit to Stand - Progress: Goal set today Pt will go Stand to Sit: with modified independence PT Goal: Stand to Sit - Progress: Goal set today Pt will Transfer Bed to Chair/Chair to Bed: with modified independence PT Transfer Goal: Bed to Chair/Chair to Bed - Progress: Goal set today Pt will Ambulate: >150 feet;with modified independence;with least restrictive assistive device PT Goal: Ambulate - Progress: Goal set today Pt will Go Up / Down Stairs: 1-2 stairs;with min assist;with rail(s) PT Goal: Up/Down Stairs - Progress: Goal set today Pt will Perform Home Exercise Program: with supervision, verbal cues required/provided PT Goal: Perform Home Exercise Program - Progress: Goal set today  Visit Information  Last PT Received On: 11/24/12 Assistance Needed: +1    Subjective Data  Subjective: I need to wet. Patient Stated Goal: home   Prior Functioning  Home Living Lives With: Spouse Available Help at Discharge: Family;Available  24 hours/day Type of Home: House Home Access: Stairs to enter [employer]  of Steps: 1 Entrance Stairs-Rails: Right Home Layout: One level Bathroom Toilet: Standard Home Adaptive Equipment: None Prior Function Level of Independence: Independent Able to Take Stairs?: Yes Vocation: Retired Musician: Surveyor, mining Overall Cognitive Status: Appears within functional limits for tasks assessed/performed Arousal/Alertness: Awake/alert Orientation Level: Appears intact for tasks assessed Behavior During Session: [facility] for tasks performed    Extremity/Trunk Assessment Right Upper Extremity Assessment RUE ROM/Strength/Tone: [facility] for tasks assessed Left Upper Extremity Assessment LUE ROM/Strength/Tone: WFL for tasks assessed Right Lower Extremity Assessment RLE ROM/Strength/Tone: Deficits RLE ROM/Strength/Tone Deficits: generalized weakness, grossly 4/5 Left Lower Extremity Assessment LLE ROM/Strength/Tone: Deficits LLE ROM/Strength/Tone Deficits: generalized weakness, grossly 4/5 Trunk Assessment Trunk Assessment: Kyphotic   Balance High Level Balance High Level Balance Comments: while performing transfer from 3in1 and turning to the left (while holding onto RW) pt experienced posterior LOB needing to sit onto bedside to recover  End of Session PT - End of Session Equipment Utilized During Treatment: Gait belt Activity Tolerance: Patient tolerated treatment well Patient left: in chair;with call bell/phone within reach Nurse Communication: Mobility status;Patient requests pain meds  GP     [facility] HELEN 11/24/2012, 4:01 PM

## 2012-11-24 NOTE — Progress Notes (Signed)
PT Cancellation Note  Patient Details Name: Erica Howard MRN: [id number] DOB: Mar 11, 1927   Cancelled Treatment:    Reason Eval/Treat Not Completed: Medical issues which prohibited therapy. Potassium 3.0 without repletion. Will continue to f/u.      [facility] HELEN 11/24/2012, 8:49 AM Pager: [phone]

## 2012-11-24 NOTE — Progress Notes (Signed)
   SUBJECTIVE:  She thinks that her breathing is better.  No distress   PHYSICAL EXAM Filed Vitals:   11/23/12 1316 11/23/12 1643 11/23/12 2018 11/24/12 0516  BP: 121/66  127/62 124/73  Pulse: 89  98 98  Temp: 98 F (36.7 C)  98 F (36.7 C) 98.1 F (36.7 C)  TempSrc: Oral  Oral Oral  Resp: 20  20 20   Height:      Weight:    128 lb 12 oz (58.4 kg)  SpO2: 98% 98% 98% 98%   General:  No distress Lungs:  Decreased breath sounds but no crackles Heart:  RRR Abdomen:  Positive bowel sounds, no rebound no guarding Extremities:  No edema, right groin OK.    LABS: Lab Results  Component Value Date   TROPONINI 1.12* 11/21/2012   Results for orders placed during the hospital encounter of 11/20/12 (from the past 24 hour(s))  CBC     Status: Abnormal   Collection Time   11/24/12  5:25 AM      Component Value Range   WBC 13.8 (*) 4.0 - 10.5 K/uL   RBC 4.55  3.87 - 5.11 MIL/uL   Hemoglobin 13.9  12.0 - 15.0 g/dL   HCT 16.1  09.6 - 04.5 %   MCV 88.6  78.0 - 100.0 fL   MCH 30.5  26.0 - 34.0 pg   MCHC 34.5  30.0 - 36.0 g/dL   RDW 40.9  81.1 - 91.4 %   Platelets 274  150 - 400 K/uL    Intake/Output Summary (Last 24 hours) at 11/24/12 0657 Last data filed at 11/24/12 0400  Gross per 24 hour  Intake    894 ml  Output   1200 ml  Net   -306 ml   TELEMETRY:  NSR, no atrial fib.  11/24/2012  ASSESSMENT AND PLAN:  Cardiomyopathy:   Consistent with a Takatsubo cardiomyopathy.  I will switch to PO Lasix.   Atrial fibrillation:  Transient. Possibly related to acute exacerbation of chronic lung disease.  Amiodarone is off.  At this point I have not started warfarin.    [name] 11/24/2012 6:57 AM

## 2012-11-24 NOTE — Progress Notes (Addendum)
ANTIBIOTIC CONSULT NOTE - FOLLOW UP  Pharmacy Consult for Rocephin Indication: COPD exacerbation vs LLL infiltrate  Allergies  Allergen Reactions  . Ibuprofen Rash    Patient Measurements: Height: 5' 3.5" (161.3 cm) Weight: 128 lb 12 oz (58.4 kg) IBW/kg (Calculated) : 53.55   Vital Signs: Temp: 98.1 F (36.7 C) (02/05 0516) Temp src: Oral (02/05 0828) BP: 147/76 mmHg (02/05 0859) Pulse Rate: 95  (02/05 0859) Intake/Output from previous day: 02/04 0701 - 02/05 0700 In: 894 [P.O.:540; IV Piggyback:354] Out: 1200 [Urine:1200] Intake/Output from this shift: Total I/O In: 240 [P.O.:240] Out: -   Labs:  Basename 11/24/12 0525 11/23/12 0514 11/22/12 0640  WBC 13.8* 10.5 14.2*  HGB 13.9 13.2 12.6  PLT 274 241 249  LABCREA -- -- --  CREATININE 0.99 0.96 0.93   Estimated Creatinine Clearance: 36.4 ml/min (by C-G formula based on Cr of 0.99). No results found for this basename: VANCOTROUGH:2,VANCOPEAK:2,VANCORANDOM:2,GENTTROUGH:2,GENTPEAK:2,GENTRANDOM:2,TOBRATROUGH:2,TOBRAPEAK:2,TOBRARND:2,AMIKACINPEAK:2,AMIKACINTROU:2,AMIKACIN:2, in the last 72 hours   Microbiology: Recent Results (from the past 720 hour(s))  MRSA PCR SCREENING     Status: Normal   Collection Time   11/21/12 12:10 AM      Component Value Range Status Comment   MRSA by PCR NEGATIVE  NEGATIVE Final     Anti-infectives     Start     Dose/Rate Route Frequency Ordered Stop   11/21/12 0045   cefTRIAXone (ROCEPHIN) 1 g in dextrose 5 % 50 mL IVPB        1 g 100 mL/hr over 30 Minutes Intravenous Daily at bedtime 11/21/12 0034     11/21/12 0015   azithromycin (ZITHROMAX) 500 mg in dextrose 5 % 250 mL IVPB        500 mg 250 mL/hr over 60 Minutes Intravenous Every 24 hours 11/21/12 0006            Assessment: Pt on rocephin and azithromycin Day #4 for ?LLL infiltrate vs AE COPD. Afebrile, WBC 13.8. LA up at 4.4 (2/2).   Resp Cx: pend  CTX 2/2>> azithro 2/2>>  This patient is receiving Azithromycin by  the intravenous route.  Based on criteria approved by the Pharmacy and Therapeutics Committee, the antibiotic(s) is/are being converted to the equivalent oral dose form(s).  DESCRIPTION: These criteria include:  Patient being treated for a respiratory tract infection, urinary tract infection, or cellulitis  The patient is not neutropenic and does not exhibit a GI malabsorption state  The patient is eating (either orally or via tube) and/or has been taking other orally administered medications for a least 24 hours  The patient is improving clinically and has a Tmax < 100.5  If you have questions about this conversion, please contact the Pharmacy Department  []   [phone] )  [facility] [x]   [phone] )  [facility]  []   [phone] )  [facility] []   [phone] )  [facility]      Goal of Therapy:  Eradication of infection  Plan:  1. Continue Rocephin 1gm IV q24h. This medication does not need to be adjusted for renal dysfunction so pharmacy will sign off. Consider max of 7-day abx course. 2. Will change azithromycin to po per P&T policy.  Please reconsult pharmacy if needed.  Christoper Fabian, PharmD, BCPS Clinical pharmacist, pager [phone] 11/24/2012,9:12 AM

## 2012-11-25 DIAGNOSIS — I509 Heart failure, unspecified: Secondary | ICD-10-CM

## 2012-11-25 DIAGNOSIS — R9431 Abnormal electrocardiogram [ECG] [EKG]: Secondary | ICD-10-CM

## 2012-11-25 DIAGNOSIS — I10 Essential (primary) hypertension: Secondary | ICD-10-CM

## 2012-11-25 LAB — BASIC METABOLIC PANEL
BUN: 33 mg/dL — ABNORMAL HIGH (ref 6–23)
CO2: 35 mEq/L — ABNORMAL HIGH (ref 19–32)
Chloride: 91 mEq/L — ABNORMAL LOW (ref 96–112)
Glucose, Bld: 105 mg/dL — ABNORMAL HIGH (ref 70–99)
Potassium: 3.2 mEq/L — ABNORMAL LOW (ref 3.5–5.1)
Sodium: 135 mEq/L (ref 135–145)

## 2012-11-25 MED ORDER — POTASSIUM CHLORIDE CRYS ER 20 MEQ PO TBCR
30.0000 meq | EXTENDED_RELEASE_TABLET | ORAL | Status: AC
  Administered 2012-11-25 (×2): 30 meq via ORAL
  Filled 2012-11-25 (×2): qty 1

## 2012-11-25 MED ORDER — FUROSEMIDE 40 MG PO TABS
40.0000 mg | ORAL_TABLET | Freq: Every day | ORAL | Status: DC
  Administered 2012-11-25 – 2012-11-26 (×2): 40 mg via ORAL
  Filled 2012-11-25 (×2): qty 1

## 2012-11-25 MED ORDER — IPRATROPIUM-ALBUTEROL 18-103 MCG/ACT IN AERO: 2.0000 | INHALATION_SPRAY | RESPIRATORY_TRACT | Status: DC | PRN

## 2012-11-25 NOTE — Progress Notes (Signed)
[facility] PROGRESS NOTE  Erica Howard OZH:[id number] DOB: [birth year] DOA: 11/20/2012 PCP: Erica Pitt, MD  Assessment/Plan: 1. Acute COPD exacerbation with LLL infiltrate  On IV solumedrol , will change to po prednisone today. Nebs, oxygen and iv antibiotics to complete the course. started tapering steroids today.   2. NSTEMI/ Cardiomyopathy : S/p cardiac cath with non obstructive CAD. On lasix and digoxin.   3. Paroxysmal Atrial fibrillation: rate better controlled.  4. Hypokalemia: will be repleted as needed.  Magnesium level normal. 5. Leukocytosis probably from steroids. She is afebrile and non toxic looking.  DVT prophylaxis    Code Status: full code Family Communication: none at bedside Disposition Plan: discharge in am.    Consultants:  PCCM signed off on 2/4  cardiology  HPI/Subjective: No new complaints.   Objective: Filed Vitals:   11/25/12 0630 11/25/12 0825 11/25/12 1020 11/25/12 1336  BP: 117/59  135/65 104/53  Pulse: 78  93 76  Temp: 98.1 F (36.7 C)   97.7 F (36.5 C)  TempSrc: Oral   Oral  Resp: 18   20  Height:      Weight: 58.3 kg (128 lb 8.5 oz)     SpO2: 96% 95%  98%    Intake/Output Summary (Last 24 hours) at 11/25/12 1708 Last data filed at 11/25/12 1600  Gross per 24 hour  Intake    960 ml  Output    200 ml  Net    760 ml   Filed Weights   11/23/12 1104 11/24/12 0516 11/25/12 0630  Weight: 58.8 kg (129 lb 10.1 oz) 58.4 kg (128 lb 12 oz) 58.3 kg (128 lb 8.5 oz)    Exam:  General: Pleasant, NAD.  Neuro: Alert and oriented X 3. Moves all extremities.  Neck: Supple without bruits or JVD.  Lungs:  CTAB, no wheezing.  Heart: RRR no s3, s4, or murmurs.  Abdomen: Soft, non-tender, non-distended, BS + x 4.  Extremities: No clubbing, cyanosis or edema. DP/PT/Radials 2+ and equal bilaterally.  Data Reviewed: Basic Metabolic Panel:  Lab 11/25/12 6295 11/24/12 0525 11/23/12 0514 11/22/12 0640 11/21/12 0026  NA 135 136 131*  127* 128*  K 3.2* 3.0* 3.7 2.9* 3.7  CL 91* 90* 90* 86* 88*  CO2 35* 36* 33* 29 25  GLUCOSE 105* 118* 154* 131* 170*  BUN 33* 37* 34* 32* 29*  CREATININE 0.87 0.99 0.96 0.93 0.94  CALCIUM 10.1 10.0 9.6 9.7 10.8*  MG 1.8 2.1 1.6 -- 1.5  PHOS -- 2.8 3.5 -- 3.8   Liver Function Tests:  Lab 11/21/12 0026  AST 37  ALT 29  ALKPHOS 44  BILITOT 0.6  PROT 6.2  ALBUMIN 3.5   No results found for this basename: LIPASE:5,AMYLASE:5 in the last 168 hours No results found for this basename: AMMONIA:5 in the last 168 hours CBC:  Lab 11/24/12 0525 11/23/12 0514 11/22/12 0640 11/21/12 0545 11/21/12 0026  WBC 13.8* 10.5 14.2* 10.2 14.5*  NEUTROABS -- -- -- -- 13.5*  HGB 13.9 13.2 12.6 13.0 13.3  HCT 40.3 38.1 36.6 37.2 38.6  MCV 88.6 88.0 88.2 87.3 88.5  PLT 274 241 249 271 262   Cardiac Enzymes:  Lab 11/21/12 1109 11/21/12 0545 11/21/12 0018  CKTOTAL -- -- --  CKMB -- -- --  CKMBINDEX -- -- --  TROPONINI 1.12* 1.28* 1.36*   BNP (last 3 results)  Basename 11/21/12 0018  PROBNP 18900.0*   CBG: No results found for this basename: GLUCAP:5 in the last  168 hours  Recent Results (from the past 240 hour(s))  MRSA PCR SCREENING     Status: Normal   Collection Time   11/21/12 12:10 AM      Component Value Range Status Comment   MRSA by PCR NEGATIVE  NEGATIVE Final      Studies: No results found.  Scheduled Meds:    . aspirin  81 mg Oral Daily  . azithromycin  500 mg Oral Q24H  . carvedilol  3.125 mg Oral BID WC  . cefTRIAXone (ROCEPHIN)  IV  1 g Intravenous QHS  . enalapril  10 mg Oral Daily  . furosemide  40 mg Oral Daily  . heparin subcutaneous  5,000 Units Subcutaneous Q8H  . pantoprazole  40 mg Oral Q1200  . polyethylene glycol  17 g Oral Daily  . predniSONE  60 mg Oral QAC breakfast  . simvastatin  40 mg Oral q1800   Continuous Infusions:   Active Problems:  NSTEMI (non-ST elevated myocardial infarction)  COPD (chronic obstructive pulmonary disease)   Hypertension  Hyponatremia  Hypokalemia  Atrial fibrillation with RVR  Takotsubo syndrome        Erica Howard  [facility] Pager [phone]. If 8PM-8AM, please contact night-coverage at [email or web address], password [facility] 11/25/2012, 5:08 PM  LOS: 5 days

## 2012-11-25 NOTE — Progress Notes (Signed)
Utilization review completed.  

## 2012-11-25 NOTE — Care Management Note (Addendum)
    Page 1 of 2   11/26/2012     2:45:31 PM   CARE MANAGEMENT NOTE 11/26/2012  Patient:  Erica Howard, Erica Howard   Account Number:  [id number]  Date Initiated:  11/24/2012  Documentation initiated by:  Donn Pierini  Subjective/Objective Assessment:   Pt admitted with stemi, COPD     Action/Plan:   PTA pt lived at home with spouse- NCM to follow for d/c needs and planning   Anticipated DC Date:  11/26/2012   Anticipated DC Plan:  HOME W HOME HEALTH SERVICES      DC Planning Services  CM consult      [facility] Choice  HOME HEALTH   Choice offered to / List presented to:  C-1 Patient   DME arranged  Levan Hurst      DME agency  [facility]     [facility] arranged  HH-1 RN  HH-10 DISEASE MANAGEMENT  HH-2 PT      [facility] agency  [facility]   Status of service:  Completed, signed off Medicare Important Message given?   (If response is "NO", the following Medicare IM given date fields will be blank) Date Medicare IM given:   Date Additional Medicare IM given:    Discharge Disposition:  HOME W HOME HEALTH SERVICES  Per UR Regulation:  Reviewed for med. necessity/level of care/duration of stay  If discussed at Long Length of Stay Meetings, dates discussed:    Comments:  11/26/12- 1430- Donn Pierini RN, BSN [phone] Pt for d/c today, order for RW- call made to Derrian with [facility] for DME- also pt has order for BMP in Monday 11/29/12 -[facility] to begin within 24-48 hr. post discharge.  11/25/12 @ 1010.Marland KitchenMarland KitchenSpoke with patient regarding receiving HHPT.  Pt has had HH services before with [facility] and would like to continue with them.  Called Lupita Leash to inform. Oletta Cohn, RN, BSN, [place] [phone].

## 2012-11-25 NOTE — Progress Notes (Signed)
Warm compress given to patient throughout the day for chronic right-mid back pain. Also noticed embedded blackhead on upper mid area of back and is tender to touch. Patient stated that warm compress help with the pain in back in that area of the blackhead. Will continue to monitor as needed to end of shift.

## 2012-11-25 NOTE — Progress Notes (Signed)
    SUBJECTIVE:  She thinks that her breathing is better.  No distress.  No pain.     PHYSICAL EXAM Filed Vitals:   11/24/12 1215 11/24/12 1836 11/24/12 2013 11/25/12 0630  BP: 121/64 108/55 110/55 117/59  Pulse: 82 86 85 78  Temp: 98 F (36.7 C)  97.6 F (36.4 C) 98.1 F (36.7 C)  TempSrc: Oral  Oral Oral  Resp:   19 18  Height:      Weight:    128 lb 8.5 oz (58.3 kg)  SpO2:   100% 96%   General:  No distress Lungs:  Decreased breath sounds but no crackles, no wheezing Heart:  RRR, with ectopy Abdomen:  Positive bowel sounds, no rebound no guarding Extremities:  No edema, right groin OK.    LABS:  No results found for this or any previous visit (from the past 24 hour(s)).  Intake/Output Summary (Last 24 hours) at 11/25/12 0746 Last data filed at 11/24/12 2246  Gross per 24 hour  Intake    820 ml  Output    400 ml  Net    420 ml   TELEMETRY:  NSR, no atrial fib.  11/25/2012  ASSESSMENT AND PLAN:  Cardiomyopathy:   Consistent with a Takatsubo cardiomyopathy.  I switched to PO Lasix and I change to once daily.  Discontinue digoxin.   Atrial fibrillation:  Transient. Possibly related to acute exacerbation of chronic lung disease.  Amiodarone is off.  At this point I have not started warfarin.    Hypokalemia:  Looks like she received 90 meq total KCL yesterday.  I will supplement with an additional 60 meq po today.  Repeat BMET in the AM.   [name] 11/25/2012 7:46 AM

## 2012-11-26 LAB — BASIC METABOLIC PANEL
BUN: 34 mg/dL — ABNORMAL HIGH (ref 6–23)
CO2: 35 mEq/L — ABNORMAL HIGH (ref 19–32)
Chloride: 97 mEq/L (ref 96–112)
GFR calc non Af Amer: 62 mL/min — ABNORMAL LOW (ref >90)
Glucose, Bld: 99 mg/dL (ref 70–99)
Potassium: 3.8 mEq/L (ref 3.5–5.1)
Sodium: 138 mEq/L (ref 135–145)

## 2012-11-26 MED ORDER — SIMVASTATIN 40 MG PO TABS: 40.0000 mg | ORAL_TABLET | Freq: Every day | ORAL | Status: DC

## 2012-11-26 MED ORDER — HYDROCODONE-ACETAMINOPHEN 5-325 MG PO TABS: 1.0000 | ORAL_TABLET | ORAL | Status: DC | PRN

## 2012-11-26 MED ORDER — PREDNISONE 20 MG PO TABS: ORAL_TABLET | ORAL | Status: DC

## 2012-11-26 MED ORDER — ASPIRIN 81 MG PO CHEW: 81.0000 mg | CHEWABLE_TABLET | Freq: Every day | ORAL | Status: DC

## 2012-11-26 MED ORDER — POTASSIUM CHLORIDE ER 8 MEQ PO TBCR: 8.0000 meq | EXTENDED_RELEASE_TABLET | Freq: Every day | ORAL | Status: DC | PRN

## 2012-11-26 MED ORDER — CARVEDILOL 3.125 MG PO TABS: 3.1250 mg | ORAL_TABLET | Freq: Two times a day (BID) | ORAL | Status: DC

## 2012-11-26 MED ORDER — ALPRAZOLAM 0.25 MG PO TABS: 0.2500 mg | ORAL_TABLET | Freq: Three times a day (TID) | ORAL | Status: DC | PRN

## 2012-11-26 MED ORDER — FUROSEMIDE 40 MG PO TABS: 40.0000 mg | ORAL_TABLET | Freq: Every day | ORAL | Status: DC

## 2012-11-26 NOTE — Discharge Summary (Signed)
Physician Discharge Summary  Erica Howard JWJ:[id number] DOB: 07-Apr-1927 DOA: 11/20/2012  PCP: Peggye Pitt, MD  Admit date: 11/20/2012 Discharge date: 11/26/2012    Recommendations for Outpatient Follow-up:  Follow up with cardiology as recommended. Follow up with PCP in 2 weeks.   Discharge Diagnoses:  Active Problems:  NSTEMI (non-ST elevated myocardial infarction)  COPD (chronic obstructive pulmonary disease)  Hypertension  Hyponatremia  Hypokalemia  Atrial fibrillation with RVR  Takotsubo syndrome   Discharge Condition: stable  Diet recommendation: low sodium diet  Filed Weights   11/24/12 0516 11/25/12 0630 11/26/12 0539  Weight: 58.4 kg (128 lb 12 oz) 58.3 kg (128 lb 8.5 oz) 59.058 kg (130 lb 3.2 oz)    History of present illness:   79 years old female with PMH relevant for non oxygen dependent COPD, HTN, PAD and OA on chronic narcotics. She is a current smoker of 1 1/2 to 2 packs per day. On combivent at home. Recently diagnosed with poison ivy related skin rash and started on prednisone taper 3 days ago. For the last two weeks with intermittent chest pain, unclear are the characteristics and duration of pain and if it is exercise induced since the patient is on BiPAP and is poor historian. For the last two weeks she has had progressive worsening of wheezing and SOB. Her chronic cough and sputum production are stable. Denies fever or chills. Admitted initially to [facility] ED where she was found in moderate to severe respiratory distress. On exam she had crackles, wheezing and JVD. She was given lasix, NTG patch, metoprolol, IV steroids and respiratory treatments with good response. Initial ABG showed a pH: 7.38, PCO2: 37, PO2 64, Bicarb: 21 and O2 sat 94% on RA. She had an elevated troponin of 1.67, a BNP of 1100, elevated WBC of 15.3, normal kidney function, normal Hgb. Her chest X ray showed increased vascular congestion, possibly small bilateral pleural effusions,  blurring of the left hemidiaphragm tha may be related to e LLL consolidation vs effusion. At the time of my exam the patient is doing significantly better as per her daughter at bedside. The patient is awake, alert, oriented, slightly hypotensive (84/67), HR of 106, breathing comfortably on BiPAP, saturating 98% on 30% FiO2.  Hospital Course:  1. Acute COPD exacerbation with LLL infiltrate/ acute hypoxemic respiratory failure.   On IV solumedrol ,  changed to po prednisone  And discharged on prednisone taper.  Nebs, oxygen and antibiotics to complete the course.   2. Questionable NSTEMI/ Cardiomyopathy : S/p cardiac cath with non obstructive CAD. On lasix and digoxin.  3. Paroxysmal Atrial fibrillation: rate better controlled.  4. Hypokalemia:  repleted as needed. Magnesium level normal.  5. Leukocytosis probably from steroids. She is afebrile and non toxic looking.    Consultations:  none  Discharge Exam: Filed Vitals:   11/25/12 2146 11/26/12 0539 11/26/12 0927 11/26/12 1218  BP: 121/50 113/60 113/50   Pulse: 85 80 83 103  Temp: 97.3 F (36.3 C) 97.5 F (36.4 C) 97.7 F (36.5 C)   TempSrc: Oral Oral Oral   Resp: 18 20 20    Height:      Weight:  59.058 kg (130 lb 3.2 oz)    SpO2: 98% 98% 99% 90%   General: Pleasant, NAD.  Neuro: Alert and oriented X 3. Moves all extremities.  Neck: Supple without bruits or JVD.  Lungs: CTAB, no wheezing.  Heart: RRR no s3, s4, or murmurs.  Abdomen: Soft, non-tender, non-distended, BS + x  4.  Extremities: No clubbing, cyanosis or edema. DP/PT/Radials 2+ and equal bilaterally.   Discharge Instructions  Discharge Orders    Future Appointments: Provider: Department: Dept Phone: Center:   12/23/2012 3:00 PM [name], MD [address] (near [place]) [phone] [facility]     Future Orders Please Complete By Expires   Diet - low sodium heart healthy      Discharge instructions      Comments:   Follow up with PCP and  cardiology as recommended.   Activity as tolerated - No restrictions          Medication List     As of 11/26/2012  8:14 PM    STOP taking these medications         verapamil 120 MG 24 hr capsule   Commonly known as: VERELAN PM      TAKE these medications         albuterol 108 (90 BASE) MCG/ACT inhaler   Commonly known as: PROVENTIL HFA;VENTOLIN HFA   Inhale 2 puffs into the lungs every 6 (six) hours as needed. For shortness of breath      ALPRAZolam 0.25 MG tablet   Commonly known as: XANAX   Take 1 tablet (0.25 mg total) by mouth 3 (three) times daily as needed for anxiety.      aspirin 81 MG chewable tablet   Chew 1 tablet (81 mg total) by mouth daily.      carvedilol 3.125 MG tablet   Commonly known as: COREG   Take 1 tablet (3.125 mg total) by mouth 2 (two) times daily with a meal.      enalapril-hydrochlorothiazide 10-25 MG per tablet   Commonly known as: VASERETIC   Take 1 tablet by mouth daily.      furosemide 40 MG tablet   Commonly known as: LASIX   Take 1 tablet (40 mg total) by mouth daily.      HYDROcodone-acetaminophen 5-325 MG per tablet   Commonly known as: NORCO/VICODIN   Take 1 tablet by mouth every 4 (four) hours as needed.      potassium chloride 8 MEQ tablet   Commonly known as: KLOR-CON   Take 1 tablet (8 mEq total) by mouth daily as needed.      predniSONE 20 MG tablet   Commonly known as: DELTASONE   Prednisone 60 mg daily for 1 day followed by   Prednisone 40 mg daily for 3 days followed by  Prednisone 30 mg daily for 3 days followed by  Prednisone 20 mg daily for 3 days followed by   Prednisone 10 mg daily for 3 days      simvastatin 40 MG tablet   Commonly known as: ZOCOR   Take 1 tablet (40 mg total) by mouth daily at 6 PM.           Follow-up Information    Follow up with O'TOOLE, DAVID, MD. In 1 week.   Contact information:   [address] [place] [address] [phone]       Follow up with [name], MD. Call in 1 week.   Contact information:   [address] [place] [address] [phone]           The results of significant diagnostics from this hospitalization (including imaging, microbiology, ancillary and laboratory) are listed below for reference.    Significant Diagnostic Studies: Portable Chest Xray  11/21/2012  *RADIOLOGY  REPORT*  Clinical Data: Short of breath.  Frequent falls.  Weakness.  PORTABLE CHEST - 1 VIEW  Comparison: None.  Findings: Mild cardiomegaly is seen as well as ectasia of the thoracic aorta.  Diffuse interstitial infiltrates are seen suspicious for mild interstitial edema.  No evidence of pulmonary air space disease or pleural effusion.  IMPRESSION: Mild cardiomegaly and diffuse interstitial infiltrates, suspicious for mild interstitial edema/congestive heart failure.   Original Report Authenticated By: [name], M.D.     Microbiology: Recent Results (from the past 240 hour(s))  MRSA PCR SCREENING     Status: Normal   Collection Time   11/21/12 12:10 AM      Component Value Range Status Comment   MRSA by PCR NEGATIVE  NEGATIVE Final      Labs: Basic Metabolic Panel:  Lab 11/26/12 1610 11/25/12 0615 11/24/12 0525 11/23/12 0514 11/22/12 0640 11/21/12 0026  NA 138 135 136 131* 127* --  K 3.8 3.2* 3.0* 3.7 2.9* --  CL 97 91* 90* 90* 86* --  CO2 35* 35* 36* 33* 29 --  GLUCOSE 99 105* 118* 154* 131* --  BUN 34* 33* 37* 34* 32* --  CREATININE 0.85 0.87 0.99 0.96 0.93 --  CALCIUM 9.8 10.1 10.0 9.6 9.7 --  MG -- 1.8 2.1 1.6 -- 1.5  PHOS -- -- 2.8 3.5 -- 3.8   Liver Function Tests:  Lab 11/21/12 0026  AST 37  ALT 29  ALKPHOS 44  BILITOT 0.6  PROT 6.2  ALBUMIN 3.5   No results found for this basename: LIPASE:5,AMYLASE:5 in the last 168 hours No results found for this basename: AMMONIA:5 in the last 168 hours CBC:  Lab 11/24/12 0525 11/23/12 0514 11/22/12 0640 11/21/12 0545 11/21/12 0026   WBC 13.8* 10.5 14.2* 10.2 14.5*  NEUTROABS -- -- -- -- 13.5*  HGB 13.9 13.2 12.6 13.0 13.3  HCT 40.3 38.1 36.6 37.2 38.6  MCV 88.6 88.0 88.2 87.3 88.5  PLT 274 241 249 271 262   Cardiac Enzymes:  Lab 11/21/12 1109 11/21/12 0545 11/21/12 0018  CKTOTAL -- -- --  CKMB -- -- --  CKMBINDEX -- -- --  TROPONINI 1.12* 1.28* 1.36*   BNP: BNP (last 3 results)  Basename 11/21/12 0018  PROBNP 18900.0*   CBG: No results found for this basename: GLUCAP:5 in the last 168 hours     Signed:  Dawanda Mapel  [facility] 11/26/2012, 8:14 PM

## 2012-11-26 NOTE — Progress Notes (Signed)
Physical Therapy Treatment Patient Details Name: Maycel Riffe MRN: [id number] DOB: [birth year] Today's Date: 11/26/2012 Time: 4098-1191 PT Time Calculation (min): 23 min  PT Assessment / Plan / Recommendation Comments on Treatment Session  Pt admitted with NSTEMI, COPD and progressing with mobility. Pt able to maintain sats 90-92% throughout ambulation and stairs on RA with HR 93-103. Pt very pleasant and HOH and encouraged to continue use of RW at home as well as to continue HEP. will follow.     Follow Up Recommendations  Home health PT     Does the patient have the potential to tolerate intense rehabilitation     Barriers to Discharge        Equipment Recommendations  Rolling walker with 5" wheels    Recommendations for Other Services    Frequency     Plan Discharge plan remains appropriate;Frequency remains appropriate    Precautions / Restrictions Precautions Precautions: Fall   Pertinent Vitals/Pain No pain sats 90-92%    Mobility  Bed Mobility Bed Mobility: Sit to Supine Supine to Sit: 6: Modified independent (Device/Increase time);With rails;HOB flat Sit to Supine: 6: Modified independent (Device/Increase time);With rail;HOB flat Transfers Sit to Stand: 5: Supervision;From bed Stand to Sit: 5: Supervision;To bed Details for Transfer Assistance: cueing for hand placement and safety with transfer Ambulation/Gait Ambulation Distance (Feet): 350 Feet Assistive device: Rolling walker Ambulation/Gait Assistance Details: cueing for posture and position in RW as well as diretional cues Gait Pattern: Step-through pattern;Decreased stride length Gait velocity: decreased Stairs: Yes Stairs Assistance: 5: Supervision Stairs Assistance Details (indicate cue type and reason): supervision for safety Stair Management Technique: One rail Right Number of Stairs: 2     Exercises General Exercises - Lower Extremity Long Arc Quad: AROM;Both;10 reps;Seated Hip  Flexion/Marching: AROM;Both;10 reps;Seated   PT Diagnosis:    PT Problem List:   PT Treatment Interventions:     PT Goals Acute Rehab PT Goals PT Goal: Supine/Side to Sit - Progress: Met PT Goal: Sit to Supine/Side - Progress: Met PT Goal: Sit to Stand - Progress: Progressing toward goal PT Goal: Stand to Sit - Progress: Progressing toward goal PT Goal: Ambulate - Progress: Progressing toward goal PT Goal: Up/Down Stairs - Progress: Met PT Goal: Perform Home Exercise Program - Progress: Progressing toward goal  Visit Information  Last PT Received On: 11/26/12 Assistance Needed: +1    Subjective Data  Subjective: I haven't been walking out of the room yet   Cognition  Cognition Overall Cognitive Status: Appears within functional limits for tasks assessed/performed Arousal/Alertness: Awake/alert Orientation Level: Appears intact for tasks assessed Behavior During Session: [facility] for tasks performed    Balance     End of Session PT - End of Session Equipment Utilized During Treatment: Gait belt Activity Tolerance: Patient tolerated treatment well Patient left: in bed;with call bell/phone within reach Nurse Communication: Mobility status   GP     Delorse Lek 11/26/2012, 12:23 PM Delaney Meigs, PT [phone]

## 2012-11-29 ENCOUNTER — Encounter: Payer: Self-pay | Admitting: Cardiology

## 2012-12-06 ENCOUNTER — Telehealth: Payer: Self-pay | Admitting: Cardiology

## 2012-12-06 NOTE — Telephone Encounter (Signed)
FYI

## 2012-12-06 NOTE — Telephone Encounter (Signed)
Pt refusing therapy today due to back pain

## 2012-12-10 ENCOUNTER — Telehealth: Payer: Self-pay | Admitting: *Deleted

## 2012-12-10 DIAGNOSIS — I10 Essential (primary) hypertension: Secondary | ICD-10-CM

## 2012-12-10 NOTE — Telephone Encounter (Signed)
Left message for patient to call office.  

## 2012-12-10 NOTE — Telephone Encounter (Signed)
Daughter informed. Lab order faxed to [facility] lab. Copy sent to [facility] office. Patients daughter informed nurse that PT has been put on hold due to patient becoming extremely exhausted and having pain all over. Daughter said that she requested it be held and wanted MD to be aware. Nurse informed daughter that if patient became acute, that patient needed to go to ED for evaluation, otherwise she could call her pain management doctor for options on pain management. Daughter verbalized understanding of plan.

## 2012-12-10 NOTE — Telephone Encounter (Signed)
Message copied by Eustace Moore on Fri Dec 10, 2012 10:43 AM ------      Message from: [name]      Created: Wed Dec 08, 2012 12:59 PM       Na is slightly low.  Repeat a BMET before her appt with me in early March.  Call Ms. Cannady with the results and send results to Peggye Pitt, MD       ------

## 2012-12-13 ENCOUNTER — Telehealth: Payer: Self-pay | Admitting: Cardiology

## 2012-12-13 NOTE — Telephone Encounter (Signed)
New problem   [name] stated pt had 2 sessions of therapy and is refusing to have any more session. Just want to let the Dr know she will be putting her on hold

## 2012-12-13 NOTE — Telephone Encounter (Signed)
Dr Antoine Poche is aware - no changes needed at this time

## 2012-12-17 ENCOUNTER — Ambulatory Visit (INDEPENDENT_AMBULATORY_CARE_PROVIDER_SITE_OTHER): Payer: Medicare Other | Admitting: Cardiology

## 2012-12-17 ENCOUNTER — Encounter: Payer: Self-pay | Admitting: Cardiology

## 2012-12-17 ENCOUNTER — Telehealth: Payer: Self-pay | Admitting: *Deleted

## 2012-12-17 VITALS — BP 117/73 | HR 112 | Ht 64.5 in | Wt 136.0 lb

## 2012-12-17 DIAGNOSIS — I4891 Unspecified atrial fibrillation: Secondary | ICD-10-CM

## 2012-12-17 MED ORDER — CARVEDILOL 6.25 MG PO TABS: 6.2500 mg | ORAL_TABLET | Freq: Two times a day (BID) | ORAL | Status: DC

## 2012-12-17 NOTE — Patient Instructions (Addendum)
Your physician recommends that you schedule a follow-up appointment in: 1 months. Your physician has recommended you make the following change in your medication: Increase furosemide to 60 mg (1&1/2 tablets) daily for 2 days only, then return to previous dose of 40 mg daily. Increased your carvedilol to 6.25 mg twice daily. You may take (2) of your 3.125 mg tablets twice daily until they are finished. Your new prescription has been sent to your pharmacy. All other medications will remain the same. Your physician recommends that you return for lab work in: 2 weeks around 12/31/12 for BMET at [facility].

## 2012-12-17 NOTE — Progress Notes (Signed)
HPI She was discharged recently from [facility] with an acute COPD exacerbation.  She did have elevated enzymes but no obstructive CAD on cath.  She had PAF.  Since being discharged she was to take PT but refused because of diffuse body aching. Echo suggested Takatsubo cardiomyopathy.  EF was 35 - 40%. She presents with increased swelling since being given some NSAID cream for her back.  She is not describing any new SOB, PND or orthopnea.  She has no weight gain.  She has no chest pain, palpitations or presyncope.  She is very limited by acute on chronic severe back pain.     Allergies  Allergen Reactions  . Ibuprofen Rash    Current Outpatient Prescriptions  Medication Sig Dispense Refill  . albuterol (PROVENTIL HFA;VENTOLIN HFA) 108 (90 BASE) MCG/ACT inhaler Inhale 2 puffs into the lungs every 6 (six) hours as needed. For shortness of breath      . ALPRAZolam (XANAX) 0.25 MG tablet Take 1 tablet (0.25 mg total) by mouth 3 (three) times daily as needed for anxiety.  30 tablet  0  . aspirin 81 MG chewable tablet Chew 1 tablet (81 mg total) by mouth daily.  30 tablet  1  . carvedilol (COREG) 3.125 MG tablet Take 1 tablet (3.125 mg total) by mouth 2 (two) times daily with a meal.  60 tablet  1  . enalapril-hydrochlorothiazide (VASERETIC) 10-25 MG per tablet Take 1 tablet by mouth daily.      . furosemide (LASIX) 40 MG tablet Take 1 tablet (40 mg total) by mouth daily.  30 tablet  0  . HYDROcodone-acetaminophen (NORCO) 10-325 MG per tablet Take 1 tablet by mouth every 6 (six) hours as needed for pain.      . potassium chloride (KLOR-CON) 8 MEQ tablet Take 8 mEq by mouth daily.      . simvastatin (ZOCOR) 40 MG tablet Take 1 tablet (40 mg total) by mouth daily at 6 PM.  30 tablet  0   No current facility-administered medications for this visit.    Past Medical History  Diagnosis Date  . COPD (chronic obstructive pulmonary disease)   . Peripheral vascular disease   . Hypertension   . Arthritis    . Myocardial infarction 11/20/2012    Non obstructive CAD  . CHF (congestive heart failure) 11/20/2012    EF 35% Takatsubo  . Tobacco abuse     Past Surgical History  Procedure Laterality Date  . No past surgeries      ROS:  As stated in the HPI and negative for all other systems.  PHYSICAL EXAM BP 117/73  Pulse 112  Ht 5' 4.5" (1.638 m)  Wt 136 lb (61.689 kg)  BMI 22.99 kg/m2 GENERAL:  Frail appearing HEENT:  Pupils equal round and reactive, fundi not visualized, oral mucosa unremarkable NECK:  No jugular venous distention, waveform within normal limits, carotid upstroke brisk and symmetric, no bruits, no thyromegaly LYMPHATICS:  No cervical, inguinal adenopathy LUNGS:  Clear to auscultation bilaterally BACK:  No CVA tenderness, diffuse back tenderness and  CHEST:  Unremarkable HEART:  PMI not displaced or sustained,S1 and S2 within normal limits, no S3, no S4, no clicks, no rubs, no murmurs ABD:  Flat, positive bowel sounds normal in frequency in pitch, no bruits, no rebound, no guarding, no midline pulsatile mass, no hepatomegaly, no splenomegaly EXT:  2 plus pulses throughout, mild/mod ankle edema edema, no cyanosis no clubbing SKIN:  No rashes no nodules NEURO:  Cranial nerves II through XII grossly intact, motor grossly intact throughout [facility]:  Cognitively intact, oriented to person place and time  EKG:  NSR, rate 112, RBBB, PACs.  12/17/2012  ASSESSMENT AND PLAN  Cardiomyopathy: Non ischemic.  I will increase her beta blocker.  I will probably plan a repeat echo in six months.  Edema: I will have her take 20 mg more of Lasix daily for two days.  She will otherwise continue the meds as listed.  HTN: This is being managed in the context of treating his CHF  Hyponatremia: Na is slightly low (Dr. Scharlene Gloss office).  I reviewed this with the patient and will repeat in two weeks.

## 2012-12-17 NOTE — Telephone Encounter (Signed)
Error

## 2012-12-23 ENCOUNTER — Encounter: Payer: Medicare Other | Admitting: Cardiology

## 2013-01-17 ENCOUNTER — Ambulatory Visit (INDEPENDENT_AMBULATORY_CARE_PROVIDER_SITE_OTHER): Payer: Medicare Other | Admitting: Cardiology

## 2013-01-17 ENCOUNTER — Ambulatory Visit: Payer: Self-pay | Admitting: Cardiology

## 2013-01-17 ENCOUNTER — Encounter: Payer: Self-pay | Admitting: Cardiology

## 2013-01-17 VITALS — BP 122/73 | HR 94 | Ht 64.5 in | Wt 132.0 lb

## 2013-01-17 DIAGNOSIS — I214 Non-ST elevation (NSTEMI) myocardial infarction: Secondary | ICD-10-CM

## 2013-01-17 DIAGNOSIS — I5181 Takotsubo syndrome: Secondary | ICD-10-CM

## 2013-01-17 DIAGNOSIS — I4891 Unspecified atrial fibrillation: Secondary | ICD-10-CM

## 2013-01-17 DIAGNOSIS — I10 Essential (primary) hypertension: Secondary | ICD-10-CM

## 2013-01-17 MED ORDER — CARVEDILOL 6.25 MG PO TABS: 9.3750 mg | ORAL_TABLET | Freq: Two times a day (BID) | ORAL | Status: DC

## 2013-01-17 NOTE — Progress Notes (Signed)
HPI She was discharged for follow up of PAF and Takatsubo cardiomyopathy.  EF was 35 - 40%. At the last visit I increased her beta blocker.  We have clarified with her pharmacy and she did increase this medication though one of her lists is incorrect. She doesn't think she had any problems with this. She's had no new chest pressure, neck or arm discomfort. She's had no palpitations, presyncope or syncope. She has had no weight gain or edema.    Allergies  Allergen Reactions  . Ibuprofen Rash    Current Outpatient Prescriptions  Medication Sig Dispense Refill  . albuterol (PROVENTIL HFA;VENTOLIN HFA) 108 (90 BASE) MCG/ACT inhaler Inhale 2 puffs into the lungs every 6 (six) hours as needed. For shortness of breath      . ALPRAZolam (XANAX) 0.25 MG tablet Take 1 tablet (0.25 mg total) by mouth 3 (three) times daily as needed for anxiety.  30 tablet  0  . aspirin 81 MG chewable tablet Chew 1 tablet (81 mg total) by mouth daily.  30 tablet  1  . carvedilol (COREG) 6.25 MG tablet Take 1 tablet (6.25 mg total) by mouth 2 (two) times daily.  180 tablet  3  . cyclobenzaprine (FLEXERIL) 5 MG tablet Take 5 mg by mouth 2 (two) times daily as needed for muscle spasms.      . enalapril-hydrochlorothiazide (VASERETIC) 10-25 MG per tablet Take 1 tablet by mouth daily.      . furosemide (LASIX) 40 MG tablet Take 1 tablet (40 mg total) by mouth daily.  30 tablet  0  . HYDROcodone-acetaminophen (NORCO) 10-325 MG per tablet Take 1 tablet by mouth every 6 (six) hours as needed for pain.      . potassium chloride (KLOR-CON) 8 MEQ tablet Take 8 mEq by mouth daily.      . simvastatin (ZOCOR) 40 MG tablet Take 1 tablet (40 mg total) by mouth daily at 6 PM.  30 tablet  0   No current facility-administered medications for this visit.    Past Medical History  Diagnosis Date  . COPD (chronic obstructive pulmonary disease)   . Peripheral vascular disease   . Hypertension   . Arthritis   . Myocardial infarction  11/20/2012    Non obstructive CAD  . CHF (congestive heart failure) 11/20/2012    EF 35% Takatsubo  . Tobacco abuse     Past Surgical History  Procedure Laterality Date  . No past surgeries      ROS:  As stated in the HPI and negative for all other systems.  PHYSICAL EXAM BP 122/73  Pulse 94  Ht 5' 4.5" (1.638 m)  Wt 132 lb (59.875 kg)  BMI 22.32 kg/m2 GENERAL:  Frail appearing NECK:  No jugular venous distention, waveform within normal limits, carotid upstroke brisk and symmetric, no bruits, no thyromegaly LUNGS:  Clear to auscultation bilaterally BACK:  No CVA tenderness, lordosis CHEST:  Unremarkable HEART:  PMI not displaced or sustained,S1 and S2 within normal limits, no S3, no S4, no clicks, no rubs, no murmurs ABD:  Flat, positive bowel sounds normal in frequency in pitch, no bruits, no rebound, no guarding, no midline pulsatile mass, no hepatomegaly, no splenomegaly EXT:  2 plus pulses throughout, mild/mod ankle edema edema, no cyanosis no clubbing   ASSESSMENT AND PLAN  Cardiomyopathy: Non ischemic.  I will increase her beta blocker again today to 9.375 mg carvedilol twice a day..  I will repeat an echo this summer.  Edema:  She was having some edema at the last visit but this is improved.  She took a few days of Lasix.  No further therapy is indicated.  HTN: This is being managed in the context of treating his CHF  Hyponatremia: Sodium was improved at the last visit. No change in therapy is indicated.

## 2013-01-17 NOTE — Patient Instructions (Addendum)
Your physician recommends that you schedule a follow-up appointment in: 2 months. Your physician has recommended you make the following change in your medication: Increased your carvedilol to 9.375 mg twice daily. Please start taking your carvedilol 6.25 mg taking 1&1/2 tablets twice daily. Your new prescription has been sent to your pharmacy. All other medications will remain the same.

## 2013-02-17 ENCOUNTER — Ambulatory Visit: Payer: Self-pay | Admitting: Physician Assistant

## 2013-03-09 ENCOUNTER — Ambulatory Visit: Payer: Self-pay | Admitting: Cardiology

## 2013-03-18 ENCOUNTER — Ambulatory Visit (INDEPENDENT_AMBULATORY_CARE_PROVIDER_SITE_OTHER): Payer: Medicare Other | Admitting: Cardiology

## 2013-03-18 ENCOUNTER — Encounter: Payer: Self-pay | Admitting: Cardiology

## 2013-03-18 VITALS — BP 114/73 | HR 96 | Ht 63.0 in | Wt 133.0 lb

## 2013-03-18 DIAGNOSIS — I4891 Unspecified atrial fibrillation: Secondary | ICD-10-CM

## 2013-03-18 DIAGNOSIS — I428 Other cardiomyopathies: Secondary | ICD-10-CM

## 2013-03-18 DIAGNOSIS — I429 Cardiomyopathy, unspecified: Secondary | ICD-10-CM

## 2013-03-18 DIAGNOSIS — I10 Essential (primary) hypertension: Secondary | ICD-10-CM

## 2013-03-18 DIAGNOSIS — I5181 Takotsubo syndrome: Secondary | ICD-10-CM

## 2013-03-18 MED ORDER — FUROSEMIDE 20 MG PO TABS: 20.0000 mg | ORAL_TABLET | Freq: Every day | ORAL | Status: DC

## 2013-03-18 MED ORDER — NITROGLYCERIN 0.4 MG SL SUBL: 0.4000 mg | SUBLINGUAL_TABLET | SUBLINGUAL | Status: AC | PRN

## 2013-03-18 NOTE — Progress Notes (Signed)
HPI She was discharged for follow up of PAF and Takatsubo cardiomyopathy.  EF was 35 - 40%. At the last visit I increased her beta blocker.   She doesn't think she had any problems with this. She's had no neck or arm discomfort. She's had no palpitations, presyncope or syncope. She has had no weight gain or edema. However, she has episodes of chest tightness. These occur sporadically. Her daughter thinks it may be related to anxiety. She stops what she is doing and takes a "chill pill" and symptoms resolved quickly. She cannot bring this on. She has had no acute shortness of breath, PND or orthopnea.     Allergies  Allergen Reactions  . Ibuprofen Rash    Current Outpatient Prescriptions  Medication Sig Dispense Refill  . ALPRAZolam (XANAX) 0.25 MG tablet Take 1 tablet (0.25 mg total) by mouth 3 (three) times daily as needed for anxiety.  30 tablet  0  . aspirin 81 MG chewable tablet Chew 1 tablet (81 mg total) by mouth daily.  30 tablet  1  . carvedilol (COREG) 6.25 MG tablet Take 1.5 tablets (9.375 mg total) by mouth 2 (two) times daily.  270 tablet  3  . COMBIVENT RESPIMAT 20-100 MCG/ACT AERS respimat       . cyclobenzaprine (FLEXERIL) 5 MG tablet Take 5 mg by mouth 2 (two) times daily as needed for muscle spasms.      . enalapril-hydrochlorothiazide (VASERETIC) 10-25 MG per tablet Take 1 tablet by mouth daily.      . furosemide (LASIX) 40 MG tablet Take 1 tablet (40 mg total) by mouth daily.  30 tablet  0  . HYDROcodone-acetaminophen (NORCO) 10-325 MG per tablet Take 1 tablet by mouth every 4 (four) hours as needed for pain.       . Multiple Vitamins-Minerals (SENIOR MULTIVITAMIN PLUS) TABS Take by mouth daily.      . potassium chloride (KLOR-CON) 8 MEQ tablet Take 8 mEq by mouth daily.      . simvastatin (ZOCOR) 40 MG tablet Take 1 tablet (40 mg total) by mouth daily at 6 PM.  30 tablet  0  . triamcinolone cream (KENALOG) 0.1 %        No current facility-administered medications for  this visit.    Past Medical History  Diagnosis Date  . COPD (chronic obstructive pulmonary disease)   . Peripheral vascular disease   . Hypertension   . Arthritis   . Myocardial infarction 11/20/2012    Non obstructive CAD  . CHF (congestive heart failure) 11/20/2012    EF 35% Takatsubo  . Tobacco abuse     Past Surgical History  Procedure Laterality Date  . No past surgeries      ROS:  As stated in the HPI and negative for all other systems.  PHYSICAL EXAM BP 114/73  Pulse 96  Ht 5\' 3"  (1.6 m)  Wt 133 lb (60.328 kg)  BMI 23.57 kg/m2 GENERAL:  Frail appearing NECK:  No jugular venous distention, waveform within normal limits, carotid upstroke brisk and symmetric, no bruits, no thyromegaly LUNGS:  Clear to auscultation bilaterally BACK:  No CVA tenderness, lordosis CHEST:  Unremarkable HEART:  PMI not displaced or sustained,S1 and S2 within normal limits, no S3, no S4, no clicks, no rubs, no murmurs ABD:  Flat, positive bowel sounds normal in frequency in pitch, no bruits, no rebound, no guarding, no midline pulsatile mass, no hepatomegaly, no splenomegaly EXT:  2 plus pulses throughout, no ankle  edema edema, no cyanosis no clubbing   ASSESSMENT AND PLAN  Cardiomyopathy: Non ischemic.  Today because of a low blood pressure I will not titrate her medications. I will be planning an echocardiogram at the end of the summer and I will see her back after this.  Edema: She has no further edema. I'm going to try to reduce her Lasix to 20 mg daily.  HTN: This is being managed in the context of treating his CHF  CHEST TIGHTNESS:   I suspect that this is noncardiac. She had nonobstructive disease on cath earlier this year.  No further cardiac workup is suggested. Her daughter thinks it might be anxiety.  I will giver her a prescription for sublingual nitroglycerin. She can take her anxiety medication to bronchodilators when this occurs as well.  ATRIAL FIBRILLATION:   She  has no symptomatic recurrence of this.

## 2013-03-18 NOTE — Patient Instructions (Signed)
   Nitroglycerin as needed for severe chest pain only  Decrease Lasix to 20mg  daily Continue all other current medications.  Echo - Due August  Office will notify of results Follow up after above testing

## 2013-04-06 ENCOUNTER — Other Ambulatory Visit: Payer: Self-pay

## 2013-05-25 ENCOUNTER — Other Ambulatory Visit: Payer: Self-pay

## 2013-05-25 ENCOUNTER — Other Ambulatory Visit (INDEPENDENT_AMBULATORY_CARE_PROVIDER_SITE_OTHER): Payer: Medicare Other

## 2013-05-25 DIAGNOSIS — I428 Other cardiomyopathies: Secondary | ICD-10-CM

## 2013-05-25 DIAGNOSIS — I4891 Unspecified atrial fibrillation: Secondary | ICD-10-CM

## 2013-05-25 DIAGNOSIS — I5181 Takotsubo syndrome: Secondary | ICD-10-CM

## 2013-05-25 DIAGNOSIS — I429 Cardiomyopathy, unspecified: Secondary | ICD-10-CM

## 2013-06-02 ENCOUNTER — Ambulatory Visit (INDEPENDENT_AMBULATORY_CARE_PROVIDER_SITE_OTHER): Payer: Medicare Other | Admitting: Cardiology

## 2013-06-02 ENCOUNTER — Encounter: Payer: Self-pay | Admitting: Cardiology

## 2013-06-02 VITALS — BP 110/69 | HR 79 | Ht 63.0 in | Wt 134.0 lb

## 2013-06-02 DIAGNOSIS — I5181 Takotsubo syndrome: Secondary | ICD-10-CM

## 2013-06-02 DIAGNOSIS — I10 Essential (primary) hypertension: Secondary | ICD-10-CM

## 2013-06-02 NOTE — Patient Instructions (Addendum)

## 2013-06-02 NOTE — Progress Notes (Signed)
HPI She was discharged for follow up of PAF and Takatsubo cardiomyopathy.  EF was 35 - 40%. At the last visit I increased her Lasix. I did repeat an echocardiogram and this demonstrates her ejection fraction is now 60%. She has some sporadic chest discomfort but hasn't required any nitroglycerin. She has baseline dyspnea but this is unchanged. She has some mild lower extremity edema but this is not increased. She did well with reduced Lasix though she occasionally takes 40 mg if her swelling is worse. She's done well after being managed for anxiety.   Allergies  Allergen Reactions  . Ibuprofen Rash    Current Outpatient Prescriptions  Medication Sig Dispense Refill  . ALPRAZolam (XANAX) 0.25 MG tablet Take 1 tablet (0.25 mg total) by mouth 3 (three) times daily as needed for anxiety.  30 tablet  0  . aspirin 81 MG chewable tablet Chew 1 tablet (81 mg total) by mouth daily.  30 tablet  1  . carvedilol (COREG) 6.25 MG tablet Take 1.5 tablets (9.375 mg total) by mouth 2 (two) times daily.  270 tablet  3  . COMBIVENT RESPIMAT 20-100 MCG/ACT AERS respimat       . cyclobenzaprine (FLEXERIL) 5 MG tablet Take 5 mg by mouth 2 (two) times daily as needed for muscle spasms.      . enalapril-hydrochlorothiazide (VASERETIC) 10-25 MG per tablet Take 1 tablet by mouth daily.      . furosemide (LASIX) 20 MG tablet Take 1 tablet (20 mg total) by mouth daily.  30 tablet  6  . HYDROcodone-acetaminophen (NORCO) 10-325 MG per tablet Take 1 tablet by mouth every 4 (four) hours as needed for pain.       . Multiple Vitamins-Minerals (SENIOR MULTIVITAMIN PLUS) TABS Take by mouth daily.      . nitroGLYCERIN (NITROSTAT) 0.4 MG SL tablet Place 1 tablet (0.4 mg total) under the tongue every 5 (five) minutes as needed for chest pain.  90 tablet  3  . potassium chloride (KLOR-CON) 8 MEQ tablet Take 8 mEq by mouth daily.      . simvastatin (ZOCOR) 40 MG tablet Take 1 tablet (40 mg total) by mouth daily at 6 PM.  30 tablet   0  . triamcinolone cream (KENALOG) 0.1 %        No current facility-administered medications for this visit.    Past Medical History  Diagnosis Date  . COPD (chronic obstructive pulmonary disease)   . Peripheral vascular disease   . Hypertension   . Arthritis   . Myocardial infarction 11/20/2012    Non obstructive CAD  . CHF (congestive heart failure) 11/20/2012    EF 35% Takatsubo  . Tobacco abuse     Past Surgical History  Procedure Laterality Date  . No past surgeries      ROS:  As stated in the HPI and negative for all other systems.  PHYSICAL EXAM BP 110/69  Pulse 79  Ht 5\' 3"  (1.6 m)  Wt 134 lb (60.782 kg)  BMI 23.74 kg/m2 GENERAL:  Frail appearing NECK:  No jugular venous distention, waveform within normal limits, carotid upstroke brisk and symmetric, no bruits, no thyromegaly LUNGS:  Clear to auscultation bilaterally BACK:  No CVA tenderness, lordosis CHEST:  Unremarkable HEART:  PMI not displaced or sustained,S1 and S2 within normal limits, no S3, no S4, no clicks, no rubs, no murmurs ABD:  Flat, positive bowel sounds normal in frequency in pitch, no bruits, no rebound, no guarding, no  midline pulsatile mass, no hepatomegaly, no splenomegaly EXT:  2 plus pulses upper mildly diminished dorsalis pedis and posterior tibialis, no ankle edema edema, no cyanosis no clubbing   ASSESSMENT AND PLAN  Cardiomyopathy: The EF is improved.  She will continue with current therapy.   Edema: She has no further edema. No change in therapy is indicated.   HTN: The blood pressure is at target. No change in medications is indicated. We will continue with therapeutic lifestyle changes (TLC).  CHEST TIGHTNESS:   No further work up is planned.    ATRIAL FIBRILLATION:   She has no symptomatic recurrence of this.

## 2013-10-15 ENCOUNTER — Other Ambulatory Visit: Payer: Self-pay | Admitting: Cardiology

## 2013-12-03 ENCOUNTER — Other Ambulatory Visit: Payer: Self-pay | Admitting: Cardiology

## 2014-01-03 ENCOUNTER — Ambulatory Visit (INDEPENDENT_AMBULATORY_CARE_PROVIDER_SITE_OTHER): Payer: Medicare Other | Admitting: Cardiology

## 2014-01-03 ENCOUNTER — Encounter: Payer: Self-pay | Admitting: Cardiology

## 2014-01-03 VITALS — BP 112/67 | HR 71 | Ht 63.0 in | Wt 132.0 lb

## 2014-01-03 DIAGNOSIS — I5181 Takotsubo syndrome: Secondary | ICD-10-CM

## 2014-01-03 DIAGNOSIS — I4891 Unspecified atrial fibrillation: Secondary | ICD-10-CM

## 2014-01-03 DIAGNOSIS — R3915 Urgency of urination: Secondary | ICD-10-CM

## 2014-01-03 DIAGNOSIS — I214 Non-ST elevation (NSTEMI) myocardial infarction: Secondary | ICD-10-CM

## 2014-01-03 DIAGNOSIS — I10 Essential (primary) hypertension: Secondary | ICD-10-CM

## 2014-01-03 MED ORDER — FUROSEMIDE 20 MG PO TABS: 20.0000 mg | ORAL_TABLET | ORAL | Status: DC

## 2014-01-03 MED ORDER — FUROSEMIDE 20 MG PO TABS: 20.0000 mg | ORAL_TABLET | Freq: Every day | ORAL | Status: DC

## 2014-01-03 MED ORDER — ONDANSETRON HCL 4 MG PO TABS: 4.0000 mg | ORAL_TABLET | Freq: Two times a day (BID) | ORAL | Status: DC | PRN

## 2014-01-03 MED ORDER — ONDANSETRON HCL 4 MG PO TABS: 4.0000 mg | ORAL_TABLET | Freq: Three times a day (TID) | ORAL | Status: DC | PRN

## 2014-01-03 NOTE — Progress Notes (Signed)
HPI She was discharged for follow up of PAF and Takatsubo cardiomyopathy.  EF was 35 - 40% at the time of her acute presentation.  However, repeat echocardiogram demonstratesd her ejection fraction improved to 60%.  Unfortunately she has continued to decline. She is quite frail. She's resistant to any help in house. Her daughter is having to try to care for her. She's having a failure to thrive. She's having nausea and anorexia. Most recently she's had intermittent lower extremity swelling which she treats with extra doses of Lasix. He does also try to keep her feet elevated. She's been having frequent urination. She's not describing any fevers or chills. She's not describing dysuria or pyuria.  She remains with her baseline dyspnea. She's not describing new PND or orthopnea.  Allergies  Allergen Reactions  . Ibuprofen Rash    Current Outpatient Prescriptions  Medication Sig Dispense Refill  . albuterol (VENTOLIN HFA) 108 (90 BASE) MCG/ACT inhaler Inhale 1 puff into the lungs as needed for wheezing or shortness of breath.      . ALPRAZolam (XANAX) 0.25 MG tablet Take 0.25 mg by mouth 2 (two) times daily as needed for anxiety.      [name]. aspirin 81 MG tablet Take 81 mg by mouth every morning.      . carvedilol (COREG) 6.25 MG tablet Take 1.5 tablets (9.375 mg total) by mouth 2 (two) times daily.  270 tablet  3  . COMBIVENT RESPIMAT 20-100 MCG/ACT AERS respimat Inhale 1 puff into the lungs daily as needed.       . cyclobenzaprine (FLEXERIL) 5 MG tablet Take 5 mg by mouth 2 (two) times daily as needed for muscle spasms.      . enalapril-hydrochlorothiazide (VASERETIC) 10-25 MG per tablet Take 1 tablet by mouth daily.      . furosemide (LASIX) 20 MG tablet TAKE 1 TABLET BY MOUTH EVERY DAY AS DIRECTED  30 tablet  1  . HYDROcodone-acetaminophen (NORCO) 10-325 MG per tablet Take 1 tablet by mouth every 4 (four) hours as needed for pain.       . Multiple Vitamins-Minerals (SENIOR MULTIVITAMIN PLUS) TABS  Take by mouth daily.      . nitroGLYCERIN (NITROSTAT) 0.4 MG SL tablet Place 1 tablet (0.4 mg total) under the tongue every 5 (five) minutes as needed for chest pain.  90 tablet  3  . polyethylene glycol (MIRALAX / GLYCOLAX) packet Take 17 g by mouth at bedtime.      . potassium chloride (KLOR-CON) 8 MEQ tablet Take 8 mEq by mouth daily.      . Probiotic Product (PROBIOTIC DAILY PO) Take 1 capsule by mouth daily.      . simvastatin (ZOCOR) 40 MG tablet Take 1 tablet (40 mg total) by mouth daily at 6 PM.  30 tablet  0  . triamcinolone cream (KENALOG) 0.1 % Apply 1 application topically as needed.        No current facility-administered medications for this visit.    Past Medical History  Diagnosis Date  . COPD (chronic obstructive pulmonary disease)   . Peripheral vascular disease   . Hypertension   . Arthritis   . Myocardial infarction 11/20/2012    Non obstructive CAD  . CHF (congestive heart failure) 11/20/2012    EF 35% Takatsubo.  Improved to 60%  . Tobacco abuse     Past Surgical History  Procedure Laterality Date  . No past surgeries      ROS:  As stated in  the HPI and negative for all other systems.  PHYSICAL EXAM BP 112/67  Pulse 71  Ht 5\' 3"  (1.6 m)  Wt 132 lb (59.875 kg)  BMI 23.39 kg/m2  SpO2 94% GENERAL:  Frail appearing NECK:  No jugular venous distention, waveform within normal limits, carotid upstroke brisk and symmetric, no bruits, no thyromegaly LUNGS:  Clear to auscultation bilaterally BACK:  No CVA tenderness, lordosis CHEST:  Unremarkable HEART:  PMI not displaced or sustained,S1 and S2 within normal limits, no S3, no S4, no clicks, no rubs, no murmurs ABD:  Flat, positive bowel sounds normal in frequency in pitch, no bruits, no rebound, no guarding, no midline pulsatile mass, no hepatomegaly, no splenomegaly EXT:  2 plus pulses upper mildly diminished dorsalis pedis and posterior tibialis, no ankle edema edema, no cyanosis no clubbing  EKG:  Sinus  rhythm, rate 73, right bundle branch block, left anterior fascicular block, no acute ST-T wave changes.  No change from prevoius. 01/03/2014  ASSESSMENT AND PLAN  Cardiomyopathy: The EF is improved.  She will continue with current therapy.   Edema: We discussed PRN dosing of her Lasix.    HTN: The blood pressure is at target. No change in medications is indicated. We will continue with therapeutic lifestyle changes (TLC).  CHEST TIGHTNESS:   She has rare chest pain.  No further testing or change in therapy is indicated.   ATRIAL FIBRILLATION:   She has no symptomatic recurrence of this.  FREQUENT URINATION: I took the liberty of ordering a UA.  NAUSEA:  I took the liberty of writing a one month prescription for Zofran.  Of note her daughter is very interested in any comfort measures that we can offer.  She will discuss this further with Dr. Margo Aye.

## 2014-01-03 NOTE — Patient Instructions (Addendum)
Your physician recommends that you schedule a follow-up appointment in: 1 year. You will receive a reminder letter in the mail in about 10 months reminding you to call and schedule your appointment. If you don't receive this letter, please contact our office. Your physician recommends that you continue on your current medications as directed. Please refer to the Current Medication list given to you today. Your physician gave you a prescription today for zofran to take 1 tablet by mouth every 12 hours as needed for nausea and vomiting. Your physician recommends that you have for lab work to check a urinalysis and culture. We will contact you with the results.

## 2014-01-04 ENCOUNTER — Telehealth: Payer: Self-pay | Admitting: *Deleted

## 2014-01-04 NOTE — Telephone Encounter (Signed)
Daughter left message on nurse's vm requesting results of Urinalysis that was done on yesterday.  Nurse called daughter and left message on her vm informing her that UA was negative and we are awaiting results of culture.

## 2014-01-09 ENCOUNTER — Telehealth: Payer: Self-pay | Admitting: *Deleted

## 2014-01-09 ENCOUNTER — Other Ambulatory Visit: Payer: Self-pay | Admitting: Cardiology

## 2014-01-09 MED ORDER — CARVEDILOL 6.25 MG PO TABS: 9.3750 mg | ORAL_TABLET | Freq: Two times a day (BID) | ORAL | Status: DC

## 2014-01-09 NOTE — Telephone Encounter (Signed)
Patient's daughter informed that culture was negative and results sent to PCP.

## 2014-06-30 ENCOUNTER — Inpatient Hospital Stay (HOSPITAL_COMMUNITY)
Admission: EM | Admit: 2014-06-30 | Discharge: 2014-07-07 | DRG: 291 | Disposition: A | Discharge status: Skilled Nursing Facility | Payer: Medicare Other | Attending: Cardiology | Admitting: Cardiology

## 2014-06-30 ENCOUNTER — Emergency Department (HOSPITAL_COMMUNITY): Payer: Medicare Other

## 2014-06-30 ENCOUNTER — Encounter (HOSPITAL_COMMUNITY): Payer: Self-pay | Admitting: Emergency Medicine

## 2014-06-30 DIAGNOSIS — I498 Other specified cardiac arrhythmias: Secondary | ICD-10-CM | POA: Diagnosis present

## 2014-06-30 DIAGNOSIS — Z23 Encounter for immunization: Secondary | ICD-10-CM

## 2014-06-30 DIAGNOSIS — I739 Peripheral vascular disease, unspecified: Secondary | ICD-10-CM | POA: Diagnosis present

## 2014-06-30 DIAGNOSIS — I4510 Unspecified right bundle-branch block: Secondary | ICD-10-CM

## 2014-06-30 DIAGNOSIS — I129 Hypertensive chronic kidney disease with stage 1 through stage 4 chronic kidney disease, or unspecified chronic kidney disease: Secondary | ICD-10-CM | POA: Diagnosis present

## 2014-06-30 DIAGNOSIS — I255 Ischemic cardiomyopathy: Secondary | ICD-10-CM

## 2014-06-30 DIAGNOSIS — I2489 Other forms of acute ischemic heart disease: Secondary | ICD-10-CM

## 2014-06-30 DIAGNOSIS — I4891 Unspecified atrial fibrillation: Secondary | ICD-10-CM | POA: Diagnosis not present

## 2014-06-30 DIAGNOSIS — J439 Emphysema, unspecified: Secondary | ICD-10-CM

## 2014-06-30 DIAGNOSIS — I2510 Atherosclerotic heart disease of native coronary artery without angina pectoris: Secondary | ICD-10-CM

## 2014-06-30 DIAGNOSIS — J9621 Acute and chronic respiratory failure with hypoxia: Secondary | ICD-10-CM

## 2014-06-30 DIAGNOSIS — R5381 Other malaise: Secondary | ICD-10-CM | POA: Diagnosis present

## 2014-06-30 DIAGNOSIS — R7309 Other abnormal glucose: Secondary | ICD-10-CM | POA: Diagnosis present

## 2014-06-30 DIAGNOSIS — R946 Abnormal results of thyroid function studies: Secondary | ICD-10-CM | POA: Diagnosis present

## 2014-06-30 DIAGNOSIS — Z823 Family history of stroke: Secondary | ICD-10-CM

## 2014-06-30 DIAGNOSIS — J441 Chronic obstructive pulmonary disease with (acute) exacerbation: Secondary | ICD-10-CM | POA: Diagnosis present

## 2014-06-30 DIAGNOSIS — N182 Chronic kidney disease, stage 2 (mild): Secondary | ICD-10-CM | POA: Diagnosis present

## 2014-06-30 DIAGNOSIS — I248 Other forms of acute ischemic heart disease: Secondary | ICD-10-CM | POA: Diagnosis present

## 2014-06-30 DIAGNOSIS — I5189 Other ill-defined heart diseases: Secondary | ICD-10-CM

## 2014-06-30 DIAGNOSIS — F17200 Nicotine dependence, unspecified, uncomplicated: Secondary | ICD-10-CM | POA: Diagnosis present

## 2014-06-30 DIAGNOSIS — I509 Heart failure, unspecified: Secondary | ICD-10-CM | POA: Diagnosis present

## 2014-06-30 DIAGNOSIS — I214 Non-ST elevation (NSTEMI) myocardial infarction: Secondary | ICD-10-CM

## 2014-06-30 DIAGNOSIS — I252 Old myocardial infarction: Secondary | ICD-10-CM | POA: Diagnosis not present

## 2014-06-30 DIAGNOSIS — Z79899 Other long term (current) drug therapy: Secondary | ICD-10-CM

## 2014-06-30 DIAGNOSIS — Z7982 Long term (current) use of aspirin: Secondary | ICD-10-CM

## 2014-06-30 DIAGNOSIS — J9620 Acute and chronic respiratory failure, unspecified whether with hypoxia or hypercapnia: Secondary | ICD-10-CM | POA: Diagnosis present

## 2014-06-30 DIAGNOSIS — Z8249 Family history of ischemic heart disease and other diseases of the circulatory system: Secondary | ICD-10-CM

## 2014-06-30 DIAGNOSIS — I959 Hypotension, unspecified: Secondary | ICD-10-CM

## 2014-06-30 DIAGNOSIS — E869 Volume depletion, unspecified: Secondary | ICD-10-CM | POA: Diagnosis not present

## 2014-06-30 DIAGNOSIS — I10 Essential (primary) hypertension: Secondary | ICD-10-CM | POA: Diagnosis present

## 2014-06-30 DIAGNOSIS — I5041 Acute combined systolic (congestive) and diastolic (congestive) heart failure: Principal | ICD-10-CM

## 2014-06-30 DIAGNOSIS — N183 Chronic kidney disease, stage 3 unspecified: Secondary | ICD-10-CM | POA: Diagnosis present

## 2014-06-30 DIAGNOSIS — R0602 Shortness of breath: Secondary | ICD-10-CM | POA: Diagnosis not present

## 2014-06-30 DIAGNOSIS — R195 Other fecal abnormalities: Secondary | ICD-10-CM | POA: Diagnosis present

## 2014-06-30 DIAGNOSIS — E876 Hypokalemia: Secondary | ICD-10-CM

## 2014-06-30 DIAGNOSIS — I2583 Coronary atherosclerosis due to lipid rich plaque: Secondary | ICD-10-CM

## 2014-06-30 DIAGNOSIS — J438 Other emphysema: Secondary | ICD-10-CM

## 2014-06-30 DIAGNOSIS — I5181 Takotsubo syndrome: Secondary | ICD-10-CM

## 2014-06-30 DIAGNOSIS — R1084 Generalized abdominal pain: Secondary | ICD-10-CM

## 2014-06-30 DIAGNOSIS — Z66 Do not resuscitate: Secondary | ICD-10-CM | POA: Diagnosis not present

## 2014-06-30 DIAGNOSIS — I249 Acute ischemic heart disease, unspecified: Secondary | ICD-10-CM

## 2014-06-30 DIAGNOSIS — R7989 Other specified abnormal findings of blood chemistry: Secondary | ICD-10-CM | POA: Diagnosis present

## 2014-06-30 LAB — CBC WITH DIFFERENTIAL/PLATELET
BASOS PCT: 0 % (ref 0–1)
Basophils Absolute: 0.1 10*3/uL (ref 0.0–0.1)
Eosinophils Absolute: 0.2 10*3/uL (ref 0.0–0.7)
Eosinophils Relative: 1 % (ref 0–5)
HCT: 41.3 % (ref 36.0–46.0)
Hemoglobin: 14 g/dL (ref 12.0–15.0)
LYMPHS PCT: 10 % — AB (ref 12–46)
Lymphs Abs: 1.2 10*3/uL (ref 0.7–4.0)
MCH: 30.8 pg (ref 26.0–34.0)
MCHC: 33.9 g/dL (ref 30.0–36.0)
MCV: 90.8 fL (ref 78.0–100.0)
MONOS PCT: 10 % (ref 3–12)
Monocytes Absolute: 1.1 10*3/uL — ABNORMAL HIGH (ref 0.1–1.0)
NEUTROS ABS: 8.7 10*3/uL — AB (ref 1.7–7.7)
NEUTROS PCT: 79 % — AB (ref 43–77)
Platelets: 251 10*3/uL (ref 150–400)
RBC: 4.55 MIL/uL (ref 3.87–5.11)
RDW: 13.7 % (ref 11.5–15.5)
WBC: 11.2 10*3/uL — AB (ref 4.0–10.5)

## 2014-06-30 LAB — COMPREHENSIVE METABOLIC PANEL
ALK PHOS: 60 U/L (ref 39–117)
ALT: 15 U/L (ref 0–35)
ANION GAP: 14 (ref 5–15)
AST: 32 U/L (ref 0–37)
Albumin: 4 g/dL (ref 3.5–5.2)
BILIRUBIN TOTAL: 0.4 mg/dL (ref 0.3–1.2)
BUN: 18 mg/dL (ref 6–23)
CHLORIDE: 93 meq/L — AB (ref 96–112)
CO2: 29 meq/L (ref 19–32)
Calcium: 10.9 mg/dL — ABNORMAL HIGH (ref 8.4–10.5)
Creatinine, Ser: 1.03 mg/dL (ref 0.50–1.10)
GFR calc Af Amer: 56 mL/min — ABNORMAL LOW (ref >90)
GFR calc non Af Amer: 48 mL/min — ABNORMAL LOW (ref >90)
Glucose, Bld: 151 mg/dL — ABNORMAL HIGH (ref 70–99)
POTASSIUM: 4.3 meq/L (ref 3.7–5.3)
Sodium: 136 mEq/L — ABNORMAL LOW (ref 137–147)
Total Protein: 6.9 g/dL (ref 6.0–8.3)

## 2014-06-30 LAB — BLOOD GAS, ARTERIAL
Acid-Base Excess: 4.2 mmol/L — ABNORMAL HIGH (ref 0.0–2.0)
BICARBONATE: 28.5 meq/L — AB (ref 20.0–24.0)
Drawn by: 105551
O2 Content: 3 L/min
O2 SAT: 95.1 %
PH ART: 7.416 (ref 7.350–7.450)
PO2 ART: 76.4 mmHg — AB (ref 80.0–100.0)
Patient temperature: 37
TCO2: 25.3 mmol/L (ref 0–100)
pCO2 arterial: 45.2 mmHg — ABNORMAL HIGH (ref 35.0–45.0)

## 2014-06-30 LAB — PRO B NATRIURETIC PEPTIDE: Pro B Natriuretic peptide (BNP): 4472 pg/mL — ABNORMAL HIGH (ref 0–450)

## 2014-06-30 LAB — TROPONIN I: Troponin I: 2.59 ng/mL (ref <0.30)

## 2014-06-30 MED ORDER — HEPARIN (PORCINE) IN NACL 100-0.45 UNIT/ML-% IJ SOLN
900.0000 [IU]/h | INTRAMUSCULAR | Status: DC
  Administered 2014-07-01: 1000 [IU]/h via INTRAVENOUS
  Administered 2014-07-03: 900 [IU]/h via INTRAVENOUS
  Filled 2014-06-30 (×5): qty 250

## 2014-06-30 MED ORDER — HEPARIN BOLUS VIA INFUSION
60.0000 [IU]/kg | Freq: Once | INTRAVENOUS | Status: AC
  Administered 2014-06-30: 3618 [IU] via INTRAVENOUS

## 2014-06-30 MED ORDER — IPRATROPIUM-ALBUTEROL 0.5-2.5 (3) MG/3ML IN SOLN
3.0000 mL | Freq: Once | RESPIRATORY_TRACT | Status: AC
  Administered 2014-06-30: 3 mL via RESPIRATORY_TRACT
  Filled 2014-06-30: qty 3

## 2014-06-30 MED ORDER — ALBUTEROL SULFATE (2.5 MG/3ML) 0.083% IN NEBU
2.5000 mg | INHALATION_SOLUTION | Freq: Once | RESPIRATORY_TRACT | Status: AC
  Administered 2014-06-30: 2.5 mg via RESPIRATORY_TRACT
  Filled 2014-06-30: qty 3

## 2014-06-30 MED ORDER — METHYLPREDNISOLONE SODIUM SUCC 125 MG IJ SOLR
125.0000 mg | Freq: Once | INTRAMUSCULAR | Status: AC
  Administered 2014-06-30: 125 mg via INTRAVENOUS
  Filled 2014-06-30: qty 2

## 2014-06-30 MED ORDER — SODIUM CHLORIDE 0.9 % IV SOLN
1000.0000 mL | Freq: Once | INTRAVENOUS | Status: AC
  Administered 2014-06-30: 500 mL via INTRAVENOUS

## 2014-06-30 MED ORDER — HEPARIN (PORCINE) IN NACL 100-0.45 UNIT/ML-% IJ SOLN
INTRAMUSCULAR | Status: AC
  Filled 2014-06-30: qty 250

## 2014-06-30 NOTE — ED Notes (Signed)
CRITICAL VALUE ALERT  Critical value received:  Troponin 2.59  Date of notification:  06/30/2014  Time of notification:  2252  Critical value read back: yes  Nurse who received alert:  [id number]  MD notified (1st page):  Dr Aileen Pilot  Time of first page:  2252  MD notified (2nd page):  Time of second page:  Responding MD:  Dr Aileen Pilot  Time MD responded:  2252

## 2014-06-30 NOTE — ED Provider Notes (Signed)
CSN: [id number]     Arrival date & time 06/30/14  2139 History   First MD Initiated Contact with Patient 06/30/14 2154    This chart was scribed for  by Gwenevere Abbot, ED scribe. This patient was seen in room APA07/APA07 and the patient's care was started at 9:55 PM.  No chief complaint on file.  The history is provided by the patient. No language interpreter was used.   HPI Comments:  Erica Howard is a 80 y.o. female who presents to the Emergency Department complaining of SOB, with associated symptoms of fluid in the lungs and lower extremities, onset 2 days ago. Daughter reports that pt's lasix were increased from 20 to 60 on today. Daughter reports that pt has been attempting to take care of spouse who has had multiple strokes, and is frequently on her feet, which increases swelling. Daughter reports that PCP is Dr. Margo Aye.  Past Medical History  Diagnosis Date  . COPD (chronic obstructive pulmonary disease)   . Peripheral vascular disease   . Hypertension   . Arthritis   . Myocardial infarction 11/20/2012    Non obstructive CAD  . CHF (congestive heart failure) 11/20/2012    EF 35% Takatsubo.  Improved to 60%  . Tobacco abuse    Past Surgical History  Procedure Laterality Date  . No past surgeries     Family History  Problem Relation Age of Onset  . Sudden death Father 27    Possible MI  . CAD Mother 43    MI   History  Substance Use Topics  . Smoking status: Current Some Day Smoker -- 1.00 packs/day for 60 years    Types: Cigarettes    Last Attempt to Quit: 11/20/2012  . Smokeless tobacco: Never Used  . Alcohol Use: No   OB History   Grav Para Term Preterm Abortions TAB SAB Ect Mult Living                 Review of Systems  Constitutional: Negative for appetite change and fatigue.  HENT: Negative for congestion, ear discharge and sinus pressure.   Eyes: Negative for discharge.  Respiratory: Positive for shortness of breath and wheezing. Negative for cough.    Cardiovascular: Positive for leg swelling. Negative for chest pain.  Gastrointestinal: Negative for abdominal pain and diarrhea.  Genitourinary: Negative for frequency and hematuria.  Musculoskeletal: Negative for back pain.  Skin: Negative for rash.  Neurological: Negative for seizures and headaches.  Psychiatric/Behavioral: Negative for hallucinations.      Allergies  Ibuprofen  Home Medications   Prior to Admission medications   Medication Sig Start Date End Date Taking? Authorizing Provider  albuterol (VENTOLIN HFA) 108 (90 BASE) MCG/ACT inhaler Inhale 1 puff into the lungs as needed for wheezing or shortness of breath.    Historical Provider, MD  ALPRAZolam Prudy Feeler) 0.25 MG tablet Take 0.25 mg by mouth 2 (two) times daily as needed for anxiety. 11/26/12   Kathlen Mody, MD  aspirin 81 MG tablet Take 81 mg by mouth every morning.    Historical Provider, MD  carvedilol (COREG) 6.25 MG tablet Take 1.5 tablets (9.375 mg total) by mouth 2 (two) times daily. 01/09/14   [name], MD  COMBIVENT RESPIMAT 20-100 MCG/ACT AERS respimat Inhale 1 puff into the lungs daily as needed.  02/22/13   Historical Provider, MD  cyclobenzaprine (FLEXERIL) 5 MG tablet Take 5 mg by mouth 2 (two) times daily as needed for muscle spasms.  Historical Provider, MD  enalapril-hydrochlorothiazide (VASERETIC) 10-25 MG per tablet Take 1 tablet by mouth daily.    Historical Provider, MD  furosemide (LASIX) 20 MG tablet Take 1-2 tablets (20-40 mg total) by mouth as directed. 01/03/14   [name], MD  furosemide (LASIX) 20 MG tablet Take 20 mg by mouth daily. 01/03/14   [name], MD  HYDROcodone-acetaminophen (NORCO) 10-325 MG per tablet Take 1 tablet by mouth every 4 (four) hours as needed for pain.     Historical Provider, MD  Multiple Vitamins-Minerals (SENIOR MULTIVITAMIN PLUS) TABS Take by mouth daily.    Historical Provider, MD  nitroGLYCERIN (NITROSTAT) 0.4 MG SL tablet Place 1 tablet (0.4 mg total)  under the tongue every 5 (five) minutes as needed for chest pain. 03/18/13   [name], MD  ondansetron (ZOFRAN) 4 MG tablet Take 1 tablet (4 mg total) by mouth 2 (two) times daily as needed for nausea or vomiting. 01/03/14   [name], MD  polyethylene glycol (MIRALAX / GLYCOLAX) packet Take 17 g by mouth at bedtime.    Historical Provider, MD  potassium chloride (KLOR-CON) 8 MEQ tablet Take 8 mEq by mouth daily. 11/26/12   Kathlen Mody, MD  Probiotic Product (PROBIOTIC DAILY PO) Take 1 capsule by mouth daily.    Historical Provider, MD  simvastatin (ZOCOR) 40 MG tablet Take 1 tablet (40 mg total) by mouth daily at 6 PM. 11/26/12   Kathlen Mody, MD  triamcinolone cream (KENALOG) 0.1 % Apply 1 application topically as needed.  01/21/13   Historical Provider, MD   BP 111/59  Pulse 105  Temp(Src) 97.9 F (36.6 C) (Oral)  Resp 32  Ht  (1.626 m)  Wt 133 lb (60.328 kg)  BMI 22.82 kg/m2  SpO2 91% Physical Exam  Constitutional: She is oriented to person, place, and time. She appears well-developed.  HENT:  Head: Normocephalic.  Eyes: Conjunctivae and EOM are normal. No scleral icterus.  Neck: Neck supple. No thyromegaly present.  Cardiovascular: Normal rate and regular rhythm.  Exam reveals no gallop and no friction rub.   No murmur heard. Pulmonary/Chest: No stridor. Tachypnea noted. She has wheezes (Mild wheezing bilaterally.). She has no rales. She exhibits no tenderness.  Abdominal: She exhibits no distension. There is no tenderness. There is no rebound.  Musculoskeletal: Normal range of motion. She exhibits no edema.  Lymphadenopathy:    She has no cervical adenopathy.  Neurological: She is oriented to person, place, and time. She exhibits normal muscle tone. Coordination normal.  Skin: No rash noted. No erythema.  Psychiatric: She has a normal mood and affect. Her behavior is normal.    ED Course  Procedures  DIAGNOSTIC STUDIES: Oxygen Saturation is 91% on RA, normal by my  interpretation.  COORDINATION OF CARE:Labs Review Labs Reviewed - No data to display  Imaging Review No results found.   EKG Interpretation None    I spoke with Dr. Terressa Koyanagi and he is accepting the pt [facility]   CRITICAL CARE Performed by: Jamilette Suchocki L Total critical care time: 40 Critical care time was exclusive of separately billable procedures and treating other patients. Critical care was necessary to treat or prevent imminent or life-threatening deterioration. Critical care was time spent personally by me on the following activities: development of treatment plan with patient and/or surrogate as well as nursing, discussions with consultants, evaluation of patient's response to treatment, examination of patient, obtaining history from patient or surrogate, ordering and performing treatments and interventions,  ordering and review of laboratory studies, ordering and review of radiographic studies, pulse oximetry and re-evaluation of patient's condition.   MDM   Final diagnoses:  None   The chart was scribed for me under my direct supervision.  I personally performed the history, physical, and medical decision making and all procedures in the evaluation of this patient.Benny Lennert, MD 06/30/14 [phone]

## 2014-06-30 NOTE — ED Notes (Signed)
Patient requesting something to drink. Gave patient ice water as requested and approved by Dr Aileen Pilot.

## 2014-06-30 NOTE — ED Notes (Signed)
Pt c/o swelling to bilateral feet x several days and pt began having shortness of breath yesterday.

## 2014-07-01 ENCOUNTER — Inpatient Hospital Stay (HOSPITAL_COMMUNITY): Payer: Medicare Other

## 2014-07-01 DIAGNOSIS — I10 Essential (primary) hypertension: Secondary | ICD-10-CM

## 2014-07-01 DIAGNOSIS — I509 Heart failure, unspecified: Secondary | ICD-10-CM

## 2014-07-01 DIAGNOSIS — J438 Other emphysema: Secondary | ICD-10-CM

## 2014-07-01 DIAGNOSIS — R1084 Generalized abdominal pain: Secondary | ICD-10-CM

## 2014-07-01 DIAGNOSIS — R109 Unspecified abdominal pain: Secondary | ICD-10-CM | POA: Insufficient documentation

## 2014-07-01 DIAGNOSIS — N183 Chronic kidney disease, stage 3 unspecified: Secondary | ICD-10-CM

## 2014-07-01 DIAGNOSIS — I214 Non-ST elevation (NSTEMI) myocardial infarction: Secondary | ICD-10-CM

## 2014-07-01 DIAGNOSIS — I959 Hypotension, unspecified: Secondary | ICD-10-CM | POA: Diagnosis present

## 2014-07-01 DIAGNOSIS — J449 Chronic obstructive pulmonary disease, unspecified: Secondary | ICD-10-CM

## 2014-07-01 DIAGNOSIS — J441 Chronic obstructive pulmonary disease with (acute) exacerbation: Secondary | ICD-10-CM

## 2014-07-01 DIAGNOSIS — I249 Acute ischemic heart disease, unspecified: Secondary | ICD-10-CM | POA: Diagnosis present

## 2014-07-01 LAB — URINALYSIS, ROUTINE W REFLEX MICROSCOPIC
Bilirubin Urine: NEGATIVE
Glucose, UA: NEGATIVE mg/dL
Ketones, ur: NEGATIVE mg/dL
LEUKOCYTES UA: NEGATIVE
Nitrite: NEGATIVE
Protein, ur: NEGATIVE mg/dL
SPECIFIC GRAVITY, URINE: 1.015 (ref 1.005–1.030)
UROBILINOGEN UA: 0.2 mg/dL (ref 0.0–1.0)
pH: 5.5 (ref 5.0–8.0)

## 2014-07-01 LAB — TROPONIN I
TROPONIN I: 1.63 ng/mL — AB (ref <0.30)
Troponin I: 1.25 ng/mL (ref <0.30)
Troponin I: 1.81 ng/mL (ref <0.30)

## 2014-07-01 LAB — HEPARIN LEVEL (UNFRACTIONATED)
HEPARIN UNFRACTIONATED: 0.65 [IU]/mL (ref 0.30–0.70)
Heparin Unfractionated: 0.67 IU/mL (ref 0.30–0.70)

## 2014-07-01 LAB — LIPASE, BLOOD: LIPASE: 24 U/L (ref 11–59)

## 2014-07-01 LAB — URINE MICROSCOPIC-ADD ON

## 2014-07-01 LAB — MAGNESIUM: Magnesium: 1.5 mg/dL (ref 1.5–2.5)

## 2014-07-01 LAB — MRSA PCR SCREENING: MRSA by PCR: NEGATIVE

## 2014-07-01 LAB — AMYLASE: AMYLASE: 36 U/L (ref 0–105)

## 2014-07-01 MED ORDER — CARVEDILOL 6.25 MG PO TABS
6.2500 mg | ORAL_TABLET | Freq: Two times a day (BID) | ORAL | Status: DC
  Filled 2014-07-01 (×3): qty 1

## 2014-07-01 MED ORDER — ENALAPRIL MALEATE 10 MG PO TABS
10.0000 mg | ORAL_TABLET | Freq: Every day | ORAL | Status: DC
  Filled 2014-07-01: qty 1

## 2014-07-01 MED ORDER — PNEUMOCOCCAL VAC POLYVALENT 25 MCG/0.5ML IJ INJ
0.5000 mL | INJECTION | INTRAMUSCULAR | Status: DC
  Filled 2014-07-01: qty 0.5

## 2014-07-01 MED ORDER — ASPIRIN EC 81 MG PO TBEC
81.0000 mg | DELAYED_RELEASE_TABLET | Freq: Every day | ORAL | Status: DC
  Administered 2014-07-01 – 2014-07-07 (×7): 81 mg via ORAL
  Filled 2014-07-01 (×7): qty 1

## 2014-07-01 MED ORDER — NITROGLYCERIN 0.4 MG SL SUBL: 0.4000 mg | SUBLINGUAL_TABLET | SUBLINGUAL | Status: DC | PRN

## 2014-07-01 MED ORDER — ONDANSETRON HCL 4 MG/2ML IJ SOLN: 4.0000 mg | Freq: Four times a day (QID) | INTRAMUSCULAR | Status: DC | PRN

## 2014-07-01 MED ORDER — ENALAPRIL MALEATE 2.5 MG PO TABS
2.5000 mg | ORAL_TABLET | Freq: Every day | ORAL | Status: DC
  Administered 2014-07-02 – 2014-07-03 (×2): 2.5 mg via ORAL
  Filled 2014-07-01 (×2): qty 1

## 2014-07-01 MED ORDER — SODIUM CHLORIDE 0.9 % IV SOLN: 250.0000 mL | INTRAVENOUS | Status: DC | PRN

## 2014-07-01 MED ORDER — FUROSEMIDE 10 MG/ML IJ SOLN
40.0000 mg | Freq: Once | INTRAMUSCULAR | Status: AC
  Administered 2014-07-01: 40 mg via INTRAVENOUS
  Filled 2014-07-01: qty 4

## 2014-07-01 MED ORDER — INFLUENZA VAC SPLIT QUAD 0.5 ML IM SUSY
0.5000 mL | PREFILLED_SYRINGE | INTRAMUSCULAR | Status: AC
  Administered 2014-07-02: 0.5 mL via INTRAMUSCULAR
  Filled 2014-07-01: qty 0.5

## 2014-07-01 MED ORDER — SIMVASTATIN 20 MG PO TABS
20.0000 mg | ORAL_TABLET | Freq: Every evening | ORAL | Status: DC
  Administered 2014-07-01 – 2014-07-06 (×6): 20 mg via ORAL
  Filled 2014-07-01 (×7): qty 1

## 2014-07-01 MED ORDER — SODIUM CHLORIDE 0.9 % IJ SOLN: 3.0000 mL | INTRAMUSCULAR | Status: DC | PRN

## 2014-07-01 MED ORDER — LEVALBUTEROL HCL 1.25 MG/0.5ML IN NEBU
1.2500 mg | INHALATION_SOLUTION | Freq: Three times a day (TID) | RESPIRATORY_TRACT | Status: DC
Start: 2014-07-01 — End: 2014-07-01
  Administered 2014-07-01 (×2): 1.25 mg via RESPIRATORY_TRACT
  Filled 2014-07-01 (×6): qty 0.5

## 2014-07-01 MED ORDER — FUROSEMIDE 40 MG PO TABS: 40.0000 mg | ORAL_TABLET | Freq: Once | ORAL | Status: DC

## 2014-07-01 MED ORDER — CARVEDILOL 3.125 MG PO TABS
3.1250 mg | ORAL_TABLET | Freq: Two times a day (BID) | ORAL | Status: DC
  Administered 2014-07-01 – 2014-07-02 (×2): 3.125 mg via ORAL
  Filled 2014-07-01 (×4): qty 1

## 2014-07-01 MED ORDER — METHYLPREDNISOLONE SODIUM SUCC 125 MG IJ SOLR
INTRAMUSCULAR | Status: AC
  Administered 2014-07-01: 60 mg via INTRAVENOUS
  Filled 2014-07-01: qty 2

## 2014-07-01 MED ORDER — ACETAMINOPHEN 325 MG PO TABS
650.0000 mg | ORAL_TABLET | ORAL | Status: DC | PRN
  Administered 2014-07-04: 650 mg via ORAL
  Filled 2014-07-01: qty 2

## 2014-07-01 MED ORDER — ENALAPRIL-HYDROCHLOROTHIAZIDE 10-25 MG PO TABS: 1.0000 | ORAL_TABLET | Freq: Every morning | ORAL | Status: DC

## 2014-07-01 MED ORDER — ALPRAZOLAM 0.25 MG PO TABS: 0.2500 mg | ORAL_TABLET | Freq: Two times a day (BID) | ORAL | Status: DC | PRN

## 2014-07-01 MED ORDER — HYDROCHLOROTHIAZIDE 25 MG PO TABS
25.0000 mg | ORAL_TABLET | Freq: Every day | ORAL | Status: DC
Start: 2014-07-01 — End: 2014-07-01
  Filled 2014-07-01: qty 1

## 2014-07-01 MED ORDER — METHYLPREDNISOLONE SODIUM SUCC 125 MG IJ SOLR
60.0000 mg | Freq: Two times a day (BID) | INTRAMUSCULAR | Status: DC
  Administered 2014-07-01 – 2014-07-04 (×6): 60 mg via INTRAVENOUS
  Filled 2014-07-01 (×8): qty 0.96

## 2014-07-01 MED ORDER — MAGNESIUM SULFATE 40 MG/ML IJ SOLN
2.0000 g | Freq: Once | INTRAMUSCULAR | Status: AC
  Administered 2014-07-01: 2 g via INTRAVENOUS
  Filled 2014-07-01: qty 50

## 2014-07-01 MED ORDER — FUROSEMIDE 10 MG/ML IJ SOLN
40.0000 mg | Freq: Once | INTRAMUSCULAR | Status: DC
  Filled 2014-07-01: qty 4

## 2014-07-01 MED ORDER — SODIUM CHLORIDE 0.9 % IJ SOLN
3.0000 mL | Freq: Two times a day (BID) | INTRAMUSCULAR | Status: DC
  Administered 2014-07-01 – 2014-07-06 (×9): 3 mL via INTRAVENOUS

## 2014-07-01 MED ORDER — FUROSEMIDE 10 MG/ML IJ SOLN
40.0000 mg | Freq: Two times a day (BID) | INTRAMUSCULAR | Status: DC
  Administered 2014-07-01 – 2014-07-03 (×5): 40 mg via INTRAVENOUS
  Filled 2014-07-01 (×7): qty 4

## 2014-07-01 MED ORDER — ADULT MULTIVITAMIN W/MINERALS CH
1.0000 | ORAL_TABLET | Freq: Every morning | ORAL | Status: DC
  Administered 2014-07-01 – 2014-07-07 (×7): 1 via ORAL
  Filled 2014-07-01 (×7): qty 1

## 2014-07-01 MED ORDER — PREDNISONE 20 MG PO TABS: 40.0000 mg | ORAL_TABLET | Freq: Every day | ORAL | Status: DC

## 2014-07-01 MED ORDER — LEVALBUTEROL HCL 1.25 MG/0.5ML IN NEBU
1.2500 mg | INHALATION_SOLUTION | Freq: Four times a day (QID) | RESPIRATORY_TRACT | Status: DC
  Administered 2014-07-01 – 2014-07-03 (×8): 1.25 mg via RESPIRATORY_TRACT
  Filled 2014-07-01 (×11): qty 0.5

## 2014-07-01 MED ORDER — SODIUM CHLORIDE 0.9 % IV SOLN
INTRAVENOUS | Status: AC
  Administered 2014-07-01: 11:00:00 via INTRAVENOUS

## 2014-07-01 MED ORDER — CYCLOBENZAPRINE HCL 10 MG PO TABS: 5.0000 mg | ORAL_TABLET | Freq: Every evening | ORAL | Status: DC | PRN

## 2014-07-01 NOTE — Progress Notes (Signed)
Pt in with respiratory distress noted. Respirations 38, accessory muscles with respirations. Oxygen at 4 liters [place]. Pulse ox 92% pt tight and wheezing in all lobes. Crackles noted RLL. Dr Adolm Joseph paged and stat Chest Xray ordered.

## 2014-07-01 NOTE — Progress Notes (Signed)
Called to pt's room. Pt seems slightly tachypneic, and wheezy. Pt on 4lpm 02 [place], and states she can't breath.

## 2014-07-01 NOTE — ED Notes (Signed)
Patient complaining of sudden return of difficulty breathing. MD at bedside. Verbal order for Lasix 40 mg IV and insert foley catheter.

## 2014-07-01 NOTE — ED Notes (Signed)
Patient given breathing treatment via EMS and states "my breathing is better." Patient alert and talking with no difficulty at this time.

## 2014-07-01 NOTE — Progress Notes (Signed)
Patient Name: Erica Howard Date of Encounter: 07/01/2014  Active Problems:   NSTEMI (non-ST elevated myocardial infarction)   ACS (acute coronary syndrome)   Length of Stay: 1  SUBJECTIVE Borderline hypotensive  A little agitated No chest discomfort. C/O abdominal cramping pain , L>R, feels she needs to have a BM Dyspneic at rest, wheezing loudly earlier, much better after bronchodilators. Not overtly orthopneic Sinus tachycardia on monitor. No clear CHF on CXR yesterday Troponin "up and down" around 2 Repeat echo is pending. 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 No new repolarization abnormalities.  CURRENT MEDS . aspirin EC  81 mg Oral Daily  . carvedilol  3.125 mg Oral BID WC  . [START ON 07/02/2014] enalapril  2.5 mg Oral Daily  . furosemide  40 mg Intravenous Once  . furosemide  40 mg Intravenous BID  . [START ON 07/02/2014] Influenza vac split quadrivalent PF  0.5 mL Intramuscular Tomorrow-1000  . levalbuterol  1.25 mg Nebulization TID  . multivitamin with minerals  1 tablet Oral q morning - 10a  . [START ON 07/02/2014] pneumococcal 23 valent vaccine  0.5 mL Intramuscular Tomorrow-1000  . simvastatin  20 mg Oral QPM  . sodium chloride  3 mL Intravenous Q12H    OBJECTIVE   Intake/Output Summary (Last 24 hours) at 07/01/14 1046 Last data filed at 07/01/14 0806  Gross per 24 hour  Intake      3 ml  Output    625 ml  Net   -622 ml   Filed Weights   06/30/14 2146 07/01/14 0230  Weight: 133 lb (60.328 kg) 133 lb (60.328 kg)    PHYSICAL EXAM Filed Vitals:   07/01/14 0030 07/01/14 0230 07/01/14 0416 07/01/14 0749  BP: 110/70 102/65 102/62 99/55  Pulse: 116 119 101 107  Temp: 98.7 F (37.1 C) 98.4 F (36.9 C) 98.9 F (37.2 C) 97.9 F (36.6 C)  TempSrc: Oral Oral Oral Oral  Resp: Height:   (1.626 m)    Weight:  133 lb (60.328  kg)    SpO2: 99% 93% 95% 96%   General: Alert, oriented x3, no distress Head: no evidence of trauma, PERRL, EOMI, no exophtalmos or lid lag, no myxedema, no xanthelasma; normal ears, nose and oropharynx Neck: normal jugular venous pulsations and no hepatojugular reflux; brisk carotid pulses without delay and no carotid bruits Chest: clear to auscultation, no signs of consolidation by percussion or palpation, normal fremitus, symmetrical and full respiratory excursions Cardiovascular: normal position and quality of the apical impulse, regular rhythm, normal first and second heart sounds, no rubs or gallops, no murmur Abdomen: slightly tender, but no peritoneal signs, no distention, no masses by palpation, no abnormal pulsatility or arterial bruits, normal bowel sounds, no hepatosplenomegaly Extremities: no clubbing, cyanosis or edema; 2+ radial, ulnar and brachial pulses bilaterally; 2+ right femoral, posterior tibial and dorsalis pedis pulses; 2+ left femoral, posterior tibial and dorsalis pedis pulses; no subclavian or femoral bruits Erythema of both feet Small 2-4 mm areas of necrosis on plantar aspect of right great toe tip, medial right great toe and dorsum of right 4th toe, but good pulses and warm feet. Neurological: grossly nonfocal  LABS  CBC  Recent Labs  06/30/14 2210  WBC 11.2*  NEUTROABS 8.7*  HGB 14.0  HCT 41.3  MCV 90.8  PLT 251   Basic Metabolic  Panel  Recent Labs  06/30/14 2210 07/01/14 0420  NA 136*  --   K 4.3  --   CL 93*  --   CO2 29  --   GLUCOSE 151*  --   BUN 18  --   CREATININE 1.03  --   CALCIUM 10.9*  --   MG  --  1.5   Liver Function Tests  Recent Labs  06/30/14 2210  AST 32  ALT 15  ALKPHOS 60  BILITOT 0.4  PROT 6.9  ALBUMIN 4.0   No results found for this basename: LIPASE, AMYLASE,  in the last 72 hours Cardiac Enzymes  Recent Labs  06/30/14 2210 07/01/14 0420 07/01/14 0810  TROPONINI 2.59* 1.25* 1.63*  No results found for  this basename: TSH, T4TOTAL, FREET3, T3FREE, THYROIDAB,  in the last 72 hours  Radiology Studies Imaging results have been reviewed and Dg Chest Portable 1 View  06/30/2014   CLINICAL DATA:  Shortness of breath and chest pain.  EXAM: PORTABLE CHEST - 1 VIEW  COMPARISON:  None.  FINDINGS: The lungs are well-aerated. Chronic vascular congestion is noted. Minimal bibasilar opacities are thought to reflect atelectasis. No definite pleural effusion or pneumothorax is seen.  The cardiomediastinal silhouette is borderline normal in size. Scattered soft tissue calcification is noted overlying the right supraclavicular region, grossly unchanged from 2014. No acute osseous abnormalities are seen. A likely loose body is noted at the right glenohumeral joint.  IMPRESSION: Chronic vascular congestion noted. Minimal bibasilar airspace opacities are thought to reflect atelectasis. Lungs otherwise grossly clear.   Electronically Signed   By: Roanna Raider M.D.   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 Prefer to delay for 24-48h until we clarify abdominal situation and improve respiratory status.   Thurmon Fair, MD, [facility] [phone] office [phone] pager 07/01/2014 10:46 AM

## 2014-07-01 NOTE — H&P (Signed)
Cardiology History and Physical  PCP: HALL, ZACH, MD Cardiologist: Dr. James Hochrein  History of Present Illness (and review of medical records): Erica Howard is a 80 y.o. female who presents for evaluation of shortness of breath.  She has hx of Atrial fibrillation and Takatsubo cardiomyopathy EF previously 35-40%, now 60% on most recent Echo.  She is poor historian.  She reports worsening shortness of breath over the past 3-4 days.  She also reports increased lower extremity swelling.  She is unclear whether or not she had chest pain.  She says her breathing was main issue.  She recently had her diurectics increased from 20 to 60m po today.  Her PCP recommended she be seen in the ED for further evaluation as she was not improving.  At [place], she had tachypnea and wheezing bilaterally.  CXR revealed chronic congestion.  BNP was elevated to  BNP 4472, Trp 2.59.  She was given IV steroids and nebulizers for her COPD.  She was give Lasix for volume overload.  She was also started on Heparin for acute coronary syndrome.  Echo 05/25/2013 ------------------------------------------------------------ Study Conclusions  - Left ventricle: The cavity size was normal. Wall thickness was increased in a pattern of mild LVH. The estimated ejection fraction was 60%. Wall motion was normal; there were no regional wall motion abnormalities. - Aortic valve: Sclerosis without stenosis. No significant regurgitation. - Right ventricle: The cavity size was mildly dilated. Systolic function was mildly reduced.  Cardiac Catheterization 11/2012 Coronary angiography:  Coronary dominance: Right  Left mainstem: Normal.  Left anterior descending (LAD): Moderate proximal calcification. Proximal and mid diffuse luminal irregularities. Proximal D1 is large and has luminal irregularities. D2 is small with ostial 30% stenosis. D3 is moderate and normal.  Left circumflex (LCx): AV groove with proximal focal 30 - 40%  stenosis leading into a large OM. The OM has mild diffuse luminal irregularities.  Ramus intermedius (RI): Large and normal.  Right coronary artery (RCA): Large and dominant. Mid moderate calcification. Mid long 25% stenosis. PDA moderate sized and normal. PL x 2 small to moderate sized and normal.  Left ventriculography: Left ventricular systolic function is reduced with anteroapical, inferoapical and apical hypokinesis, LVEF is estimated at 40%, there is no significant mitral regurgitation  Final Conclusions: Mild non obstructive CAD. Moderate LV dysfunction consistent with Takatsubo's cardiomyopathy  Recommendations: Medical management.   Review of Systems Further review of systems was otherwise negative other than stated in HPI.  Patient Active Problem List   Diagnosis Date Noted  . ACS (acute coronary syndrome) 07/01/2014  . Takotsubo syndrome 11/23/2012  . Hyponatremia 11/22/2012  . Hypokalemia 11/22/2012  . Atrial fibrillation with RVR 11/22/2012  . COPD (chronic obstructive pulmonary disease)   . Hypertension   . NSTEMI (non-ST elevated myocardial infarction) 11/21/2012   Past Medical History  Diagnosis Date  . COPD (chronic obstructive pulmonary disease)   . Peripheral vascular disease   . Hypertension   . Arthritis   . Myocardial infarction 11/20/2012    Non obstructive CAD  . CHF (congestive heart failure) 11/20/2012    EF 35% Takatsubo.  Improved to 60%  . Tobacco abuse     Past Surgical History  Procedure Laterality Date  . No past surgeries      Prescriptions prior to admission  Medication Sig Dispense Refill  . albuterol (VENTOLIN HFA) 108 (90 BASE) MCG/ACT inhaler Inhale 1 puff into the lungs as needed for wheezing or shortness of breath.      .   ALPRAZolam (XANAX) 0.25 MG tablet Take 0.25 mg by mouth 2 (two) times daily as needed for anxiety.      . aspirin EC 81 MG tablet Take 81 mg by mouth every morning.      . carvedilol (COREG) 6.25 MG tablet Take 1.5  tablets (9.375 mg total) by mouth 2 (two) times daily.  270 tablet  3  . enalapril-hydrochlorothiazide (VASERETIC) 10-25 MG per tablet Take 1 tablet by mouth every morning.       . fexofenadine (ALLEGRA) 180 MG tablet Take 180 mg by mouth every morning.      . furosemide (LASIX) 20 MG tablet Take 1-2 tablets (20-40 mg total) by mouth as directed.  60 tablet  11  . HYDROcodone-acetaminophen (NORCO) 10-325 MG per tablet Take 1 tablet by mouth every 4 (four) hours as needed for pain.       . Ipratropium-Albuterol (COMBIVENT RESPIMAT) 20-100 MCG/ACT AERS respimat Inhale 1 puff into the lungs daily.      . Multiple Vitamins-Minerals (SENIOR MULTIVITAMIN PLUS) TABS Take 1 tablet by mouth every morning.       . polyethylene glycol (MIRALAX / GLYCOLAX) packet Take 17 g by mouth at bedtime.      . potassium chloride (KLOR-CON) 8 MEQ tablet Take 8 mEq by mouth every morning.       . Probiotic Product (PROBIOTIC DAILY PO) Take 1 capsule by mouth every morning.       . simvastatin (ZOCOR) 20 MG tablet Take 20 mg by mouth every evening.      . triamcinolone cream (KENALOG) 0.1 % Apply 1 application topically daily as needed (for rash).       . cyclobenzaprine (FLEXERIL) 5 MG tablet Take 5 mg by mouth at bedtime as needed and may repeat dose one time if needed for muscle spasms.       . nitroGLYCERIN (NITROSTAT) 0.4 MG SL tablet Place 1 tablet (0.4 mg total) under the tongue every 5 (five) minutes as needed for chest pain.  90 tablet  3  . ondansetron (ZOFRAN) 4 MG tablet Take 1 tablet (4 mg total) by mouth 2 (two) times daily as needed for nausea or vomiting.  30 tablet  0   Allergies  Allergen Reactions  . Ibuprofen Rash    History  Substance Use Topics  . Smoking status: Current Some Day Smoker -- 1.00 packs/day for 60 years    Types: Cigarettes    Last Attempt to Quit: 11/20/2012  . Smokeless tobacco: Never Used  . Alcohol Use: No    Family History  Problem Relation Age of Onset  . Sudden death  Father 60    Possible MI  . CAD Mother 80    MI     Objective:  Patient Vitals for the past 8 hrs:  BP Temp Temp src Pulse Resp SpO2 Height Weight  07/01/14 0416 102/62 mmHg 98.9 F (37.2 C) Oral 101 18 95 % - -  07/01/14 0230 102/65 mmHg 98.4 F (36.9 C) Oral 119 - 93 % 5' 4" (1.626 m) 60.328 kg (133 lb)  07/01/14 0030 110/70 mmHg 98.7 F (37.1 C) Oral 116 26 99 % - -  07/01/14 0000 118/93 mmHg - - 112 27 92 % - -  06/30/14 2345 101/70 mmHg - - 102 29 94 % - -  06/30/14 2330 79/42 mmHg - - 95 19 95 % - -  06/30/14 2300 83/59 mmHg - - 98 21 94 % - -  06/30/14   2245 93/60 mmHg - - 98 21 93 % - -  06/30/14 2230 100/61 mmHg - - 97 19 97 % - -   General appearance: alert,mild distress Head: Normocephalic, without obvious abnormality, atraumatic Eyes: conjunctivae/corneas clear. PERRL, EOM's intact. Neck: positive JVD Lungs: coarse breath sounds, no wheezing Chest wall: no tenderness Heart: tachycardic Abdomen: soft, non-tender; bowel sounds normal Extremities: 1+ BLE edema Pulses: 2+ and symmetric Neurologic: Grossly normal  Results for orders placed during the hospital encounter of 06/30/14 (from the past 48 hour(s))  CBC WITH DIFFERENTIAL     Status: Abnormal   Collection Time    06/30/14 10:10 PM      Result Value Ref Range   WBC 11.2 (*) 4.0 - 10.5 K/uL   RBC 4.55  3.87 - 5.11 MIL/uL   Hemoglobin 14.0  12.0 - 15.0 g/dL   HCT 41.3  36.0 - 46.0 %   MCV 90.8  78.0 - 100.0 fL   MCH 30.8  26.0 - 34.0 pg   MCHC 33.9  30.0 - 36.0 g/dL   RDW 13.7  11.5 - 15.5 %   Platelets 251  150 - 400 K/uL   Neutrophils Relative % 79 (*) 43 - 77 %   Neutro Abs 8.7 (*) 1.7 - 7.7 K/uL   Lymphocytes Relative 10 (*) 12 - 46 %   Lymphs Abs 1.2  0.7 - 4.0 K/uL   Monocytes Relative 10  3 - 12 %   Monocytes Absolute 1.1 (*) 0.1 - 1.0 K/uL   Eosinophils Relative 1  0 - 5 %   Eosinophils Absolute 0.2  0.0 - 0.7 K/uL   Basophils Relative 0  0 - 1 %   Basophils Absolute 0.1  0.0 - 0.1 K/uL   COMPREHENSIVE METABOLIC PANEL     Status: Abnormal   Collection Time    06/30/14 10:10 PM      Result Value Ref Range   Sodium 136 (*) 137 - 147 mEq/L   Potassium 4.3  3.7 - 5.3 mEq/L   Chloride 93 (*) 96 - 112 mEq/L   CO2 29  19 - 32 mEq/L   Glucose, Bld 151 (*) 70 - 99 mg/dL   BUN 18  6 - 23 mg/dL   Creatinine, Ser 1.03  0.50 - 1.10 mg/dL   Calcium 10.9 (*) 8.4 - 10.5 mg/dL   Total Protein 6.9  6.0 - 8.3 g/dL   Albumin 4.0  3.5 - 5.2 g/dL   AST 32  0 - 37 U/L   ALT 15  0 - 35 U/L   Alkaline Phosphatase 60  39 - 117 U/L   Total Bilirubin 0.4  0.3 - 1.2 mg/dL   GFR calc non Af Amer 48 (*) >90 mL/min   GFR calc Af Amer 56 (*) >90 mL/min   Comment: (NOTE)     The eGFR has been calculated using the CKD EPI equation.     This calculation has not been validated in all clinical situations.     eGFR's persistently <90 mL/min signify possible Chronic Kidney     Disease.   Anion gap 14  5 - 15  TROPONIN I     Status: Abnormal   Collection Time    06/30/14 10:10 PM      Result Value Ref Range   Troponin I 2.59 (*) <0.30 ng/mL   Comment:            Due to the release kinetics of cTnI,       a negative result within the first hours     of the onset of symptoms does not rule out     myocardial infarction with certainty.     If myocardial infarction is still suspected,     repeat the test at appropriate intervals.     CRITICAL RESULT CALLED TO, READ BACK BY AND VERIFIED WITH:     SHORE,L ON 06/30/14 AT 2250 BY LOY,C  PRO B NATRIURETIC PEPTIDE     Status: Abnormal   Collection Time    06/30/14 10:10 PM      Result Value Ref Range   Pro B Natriuretic peptide (BNP) 4472.0 (*) 0 - 450 pg/mL  BLOOD GAS, ARTERIAL     Status: Abnormal   Collection Time    06/30/14 10:48 PM      Result Value Ref Range   O2 Content 3.0     Delivery systems NASAL CANNULA     pH, Arterial 7.416  7.350 - 7.450   pCO2 arterial 45.2 (*) 35.0 - 45.0 mmHg   pO2, Arterial 76.4 (*) 80.0 - 100.0 mmHg    Bicarbonate 28.5 (*) 20.0 - 24.0 mEq/L   TCO2 25.3  0 - 100 mmol/L   Acid-Base Excess 4.2 (*) 0.0 - 2.0 mmol/L   O2 Saturation 95.1     Patient temperature 37.0     Collection site RADIAL     Drawn by [id number]     Sample type ARTERIAL     Allens test (pass/fail) PASS  PASS  URINALYSIS, ROUTINE W REFLEX MICROSCOPIC     Status: Abnormal   Collection Time    07/01/14 12:23 AM      Result Value Ref Range   Color, Urine YELLOW  YELLOW   APPearance CLEAR  CLEAR   Specific Gravity, Urine 1.015  1.005 - 1.030   pH 5.5  5.0 - 8.0   Glucose, UA NEGATIVE  NEGATIVE mg/dL   Hgb urine dipstick TRACE (*) NEGATIVE   Bilirubin Urine NEGATIVE  NEGATIVE   Ketones, ur NEGATIVE  NEGATIVE mg/dL   Protein, ur NEGATIVE  NEGATIVE mg/dL   Urobilinogen, UA 0.2  0.0 - 1.0 mg/dL   Nitrite NEGATIVE  NEGATIVE   Leukocytes, UA NEGATIVE  NEGATIVE  URINE MICROSCOPIC-ADD ON     Status: Abnormal   Collection Time    07/01/14 12:23 AM      Result Value Ref Range   Squamous Epithelial / LPF MANY (*) RARE   WBC, UA 3-6  <3 WBC/hpf   RBC / HPF 0-2  <3 RBC/hpf   Bacteria, UA FEW (*) RARE  MRSA PCR SCREENING     Status: None   Collection Time    07/01/14  1:48 AM      Result Value Ref Range   MRSA by PCR NEGATIVE  NEGATIVE   Comment:            The GeneXpert MRSA Assay (FDA     approved for NASAL specimens     only), is one component of a     comprehensive MRSA colonization     surveillance program. It is not     intended to diagnose MRSA     infection nor to guide or     monitor treatment for     MRSA infections.  BLOOD GAS, ARTERIAL     Status: Abnormal   Collection Time    07/01/14  2:55 AM      Result Value Ref Range  O2 Content 4.0     Delivery systems NASAL CANNULA     pH, Arterial 7.439  7.350 - 7.450   pCO2 arterial 38.7  35.0 - 45.0 mmHg   pO2, Arterial 44.1 (*) 80.0 - 100.0 mmHg   Bicarbonate 26.7 (*) 20.0 - 24.0 mEq/L   TCO2 28.0  0 - 100 mmol/L   Acid-Base Excess 2.3 (*) 0.0 - 2.0 mmol/L    O2 Saturation 87.0     Patient temperature 94.0     Collection site RIGHT RADIAL     Drawn by [id number]     Sample type ARTERIAL DRAW     Allens test (pass/fail) PASS  PASS   Dg Chest Portable 1 View  06/30/2014   CLINICAL DATA:  Shortness of breath and chest pain.  EXAM: PORTABLE CHEST - 1 VIEW  COMPARISON:  None.  FINDINGS: The lungs are well-aerated. Chronic vascular congestion is noted. Minimal bibasilar opacities are thought to reflect atelectasis. No definite pleural effusion or pneumothorax is seen.  The cardiomediastinal silhouette is borderline normal in size. Scattered soft tissue calcification is noted overlying the right supraclavicular region, grossly unchanged from 2014. No acute osseous abnormalities are seen. A likely loose body is noted at the right glenohumeral joint.  IMPRESSION: Chronic vascular congestion noted. Minimal bibasilar airspace opacities are thought to reflect atelectasis. Lungs otherwise grossly clear.   Electronically Signed   By: Jeffery  Chang M.D.   On: 06/30/2014 22:20    ECG:  22:18 baseline artifact, appears sinus HR 100, RBBB, cannot rule out inferior infarct. 2302 sinus rhythm HR 93, PAC noted, FAVB, RBBB, cannot rule out inferior infarct. NS ST-T changes.  Review prior ecgs, FAVB is new, Has had prior atrial fibrillation with RVR and RBBB  Assessment: Acute congestive heart failure likely systolic NSTEMI, unclear is Type 1 vs Type 2 COPD Exacerbation Hypertension CKD Stage 2-3  Plan: 1. Cardiology Admission  2. Continuous monitoring on Telemetry. 3. Repeat ekg on admit, prn chest pain or arrythmia 4. Trend cardiac biomarkers 5. Medical management to include ASA, Heparin gtt, BB, ACEi, Statin, NTG prn 6. Gentle diuresis with IV lasix, strict I/Os, daily weights, monitor electrolytes 7. TTE in am assess LV function, wall motion 8. Supplemental oxygen, Bronchodilators prn 

## 2014-07-01 NOTE — Progress Notes (Signed)
07/01/14  Pharmacy- Heparin  Heparin level 0.65 on 1000 units/hr  A/P:  80yo female on Heparin for NSTEMI.  Heparin level this PM is stable on current rate.  No bleeding problems noted per d/w RN.    1-  Continue current rate 2-  F/U in AM  Marisue Humble, PharmD Clinical Pharmacist [facility]

## 2014-07-01 NOTE — Progress Notes (Signed)
Dr Adolm Joseph in room to see Pt. EKG obtained and given to Dr Adolm Joseph. Rapid response nurse in room to assess pt. Respiratory in room. Pt sinus tach, HR 130.

## 2014-07-01 NOTE — Progress Notes (Signed)
ANTICOAGULATION CONSULT NOTE - Initial Consult  Pharmacy Consult for Heparin Indication: chest pain/ACS  Allergies  Allergen Reactions  . Ibuprofen Rash    Patient Measurements: Height:  (162.6 cm) Weight: 133 lb (60.328 kg) IBW/kg (Calculated) : 54.7  Vital Signs: Temp: 97.9 F (36.6 C) (09/12 0749) Temp src: Oral (09/12 0749) BP: 99/55 mmHg (09/12 0749) Pulse Rate: 107 (09/12 0749)  Labs:  Recent Labs  06/30/14 2210 07/01/14 0420 07/01/14 0810  HGB 14.0  --   --   HCT 41.3  --   --   PLT 251  --   --   HEPARINUNFRC  --   --  0.67  CREATININE 1.03  --   --   TROPONINI 2.59* 1.25* 1.63*    Estimated Creatinine Clearance: 34.5 ml/min (by C-G formula based on Cr of 1.03).   Assessment: 80 yo female who presented with ACS found to have NSTEMI with troponin 2.59. MD ordered heparin bolus of 3618 units (60 units/kg) at midnight on 9/11 with maintenance drip of 1000 units/hour started after. Per 12 units/kg/hr ACS dosing, maintenance drip should be closer to 700 or 800 units/hr. Ordered HL 9/12 AM that came back at 0.67. Will continue at 1000 units/hr and order another HL in 8 hours given age >79 then reassess. Hgb and plt stable, no s/sx of bleeding.  Goal of Therapy:  Heparin level 0.3-0.7 units/ml Monitor platelets by anticoagulation protocol: Yes   Plan:  Continue heparin 1000 units/hr HL at 1800 tonight Continue to monitor for s/sx of bleeding, plt, and Hgb  Megan E. Supple, Pharm.D Clinical Pharmacy Resident Pager: [phone] 07/01/2014 10:21 AM

## 2014-07-01 NOTE — Progress Notes (Signed)
On call MD Dr. Adolm Joseph notified of pt c/o of SOB and HR. Orders for PO prednisone ordered. HR tachy BP stable. RRN called to elevate pt. Also agrees pt needs something more for respiratory status.

## 2014-07-01 NOTE — Progress Notes (Signed)
R. Barrett, PA notified of Trop slightly elevated. Will continue to montior.

## 2014-07-01 NOTE — Progress Notes (Signed)
R.Barrett, PA notified of pts c/o of SOB, ABD pain, and VS. Will see pt soon. Pt is in no acute distress at this time. VSS and resting in bed. Will continue to monitor.

## 2014-07-02 DIAGNOSIS — E876 Hypokalemia: Secondary | ICD-10-CM

## 2014-07-02 DIAGNOSIS — I5041 Acute combined systolic (congestive) and diastolic (congestive) heart failure: Secondary | ICD-10-CM | POA: Diagnosis not present

## 2014-07-02 DIAGNOSIS — I369 Nonrheumatic tricuspid valve disorder, unspecified: Secondary | ICD-10-CM

## 2014-07-02 LAB — COMPREHENSIVE METABOLIC PANEL
ALBUMIN: 3.6 g/dL (ref 3.5–5.2)
ALT: 16 U/L (ref 0–35)
AST: 36 U/L (ref 0–37)
Alkaline Phosphatase: 54 U/L (ref 39–117)
Anion gap: 17 — ABNORMAL HIGH (ref 5–15)
BILIRUBIN TOTAL: 0.4 mg/dL (ref 0.3–1.2)
BUN: 33 mg/dL — ABNORMAL HIGH (ref 6–23)
CALCIUM: 10.7 mg/dL — AB (ref 8.4–10.5)
CO2: 28 mEq/L (ref 19–32)
CREATININE: 0.98 mg/dL (ref 0.50–1.10)
Chloride: 91 mEq/L — ABNORMAL LOW (ref 96–112)
GFR calc Af Amer: 59 mL/min — ABNORMAL LOW (ref >90)
GFR calc non Af Amer: 51 mL/min — ABNORMAL LOW (ref >90)
Glucose, Bld: 165 mg/dL — ABNORMAL HIGH (ref 70–99)
Potassium: 3 mEq/L — ABNORMAL LOW (ref 3.7–5.3)
Sodium: 136 mEq/L — ABNORMAL LOW (ref 137–147)
TOTAL PROTEIN: 6.5 g/dL (ref 6.0–8.3)

## 2014-07-02 LAB — HEPARIN LEVEL (UNFRACTIONATED)
Heparin Unfractionated: 0.83 [IU]/mL — ABNORMAL HIGH (ref 0.30–0.70)
Heparin Unfractionated: 0.62 [IU]/mL (ref 0.30–0.70)

## 2014-07-02 LAB — TROPONIN I: TROPONIN I: 1.04 ng/mL — AB (ref <0.30)

## 2014-07-02 MED ORDER — METOPROLOL TARTRATE 12.5 MG HALF TABLET
12.5000 mg | ORAL_TABLET | Freq: Two times a day (BID) | ORAL | Status: DC
  Administered 2014-07-02 – 2014-07-04 (×5): 12.5 mg via ORAL
  Filled 2014-07-02 (×6): qty 1

## 2014-07-02 MED ORDER — BISACODYL 10 MG RE SUPP: 10.0000 mg | Freq: Every day | RECTAL | Status: DC | PRN

## 2014-07-02 MED ORDER — POLYETHYLENE GLYCOL 3350 17 G PO PACK
17.0000 g | PACK | Freq: Once | ORAL | Status: AC
  Administered 2014-07-02: 17 g via ORAL
  Filled 2014-07-02: qty 1

## 2014-07-02 MED ORDER — CETYLPYRIDINIUM CHLORIDE 0.05 % MT LIQD
7.0000 mL | Freq: Two times a day (BID) | OROMUCOSAL | Status: DC
  Administered 2014-07-02 – 2014-07-06 (×9): 7 mL via OROMUCOSAL

## 2014-07-02 MED ORDER — POTASSIUM CHLORIDE CRYS ER 20 MEQ PO TBCR
40.0000 meq | EXTENDED_RELEASE_TABLET | Freq: Once | ORAL | Status: AC
  Administered 2014-07-02: 40 meq via ORAL
  Filled 2014-07-02: qty 2

## 2014-07-02 MED ORDER — HYDROCODONE-ACETAMINOPHEN 5-325 MG PO TABS: 1.0000 | ORAL_TABLET | ORAL | Status: DC | PRN

## 2014-07-02 MED ORDER — POTASSIUM CHLORIDE CRYS ER 20 MEQ PO TBCR
40.0000 meq | EXTENDED_RELEASE_TABLET | Freq: Two times a day (BID) | ORAL | Status: DC
  Administered 2014-07-02 – 2014-07-03 (×3): 40 meq via ORAL
  Filled 2014-07-02 (×4): qty 2

## 2014-07-02 NOTE — Progress Notes (Signed)
Patient Name: Erica Howard Date of Encounter: 07/02/2014  Active Problems:   NSTEMI (non-ST elevated myocardial infarction)   ACS (acute coronary syndrome)   Hypotension   Abdominal pain   Length of Stay: 2  SUBJECTIVE  Breathing only minimally improved. Dyspnea does not worsen when she lies flat. Complains of abdominal discomfort and constipation.  BP remains borderline low Bilateral wheezes, no clear moist rales No JVD, no edema Abdomen is not distended, soft, weak bowel sounds in all 4 quads  CXR repeated last PM shows no evidence of CHF, but marked emphysema changes. Abdominal film shows lots of intestinal gas, no obstruction. Troponin peaked at 1.6, now decreasing ECG unchanged Echo still not performed K+  3.0, creat OK  She tells me she has obstructed arteries in her lower extremities and was told "there is nothing they can do for me". I can find no documentation of PAD evaluation, although that diagnosis is mentioned in office notes and she has clinical evidence of it. Cath 11/2012 was performed from femoral approach, no difficulties then.  CURRENT MEDS . aspirin EC  81 mg Oral Daily  . carvedilol  3.125 mg Oral BID WC  . enalapril  2.5 mg Oral Daily  . furosemide  40 mg Intravenous Once  . furosemide  40 mg Intravenous BID  . Influenza vac split quadrivalent PF  0.5 mL Intramuscular Tomorrow-1000  . levalbuterol  1.25 mg Nebulization 4 times per day  . methylPREDNISolone (SOLU-MEDROL) injection  60 mg Intravenous Q12H  . multivitamin with minerals  1 tablet Oral q morning - 10a  . pneumococcal 23 valent vaccine  0.5 mL Intramuscular Tomorrow-1000  . polyethylene glycol  17 g Oral Once  . potassium chloride  40 mEq Oral Once  . potassium chloride  40 mEq Oral BID  . simvastatin  20 mg Oral QPM  . sodium chloride  3 mL Intravenous Q12H    OBJECTIVE   Intake/Output Summary (Last 24 hours) at 07/02/14 0821 Last data filed at 07/02/14 0735  Gross per 24  hour  Intake 115.99 ml  Output   2200 ml  Net -2084.01 ml   Filed Weights   06/30/14 2146 07/01/14 0230 07/02/14 0355  Weight: 133 lb (60.328 kg) 133 lb (60.328 kg) 130 lb 11.7 oz (59.3 kg)    PHYSICAL EXAM Filed Vitals:   07/02/14 0355 07/02/14 0400 07/02/14 0500 07/02/14 0734  BP: 103/52 105/65 98/57 119/85  Pulse: 112 110 109 116  Temp: 97.5 F (36.4 C)   98 F (36.7 C)  TempSrc: Oral   Oral  Resp: Height:      Weight: 130 lb 11.7 oz (59.3 kg)     SpO2: 97%  95% 95%   General: Alert, oriented x3, no distress  Head: no evidence of trauma, PERRL, EOMI, no exophtalmos or lid lag, no myxedema, no xanthelasma; normal ears, nose and oropharynx  Neck: normal jugular venous pulsations and no hepatojugular reflux; brisk carotid pulses without delay and no carotid bruits  Chest: clear to auscultation, no signs of consolidation by percussion or palpation, normal fremitus, symmetrical and full respiratory excursions  Cardiovascular: normal position and quality of the apical impulse, regular rhythm, normal first and second heart sounds, no rubs or gallops, no murmur  Abdomen: slightly tender, but no peritoneal signs, no distention, no masses by palpation, no abnormal pulsatility or arterial bruits, normal bowel sounds, no hepatosplenomegaly  Extremities: no clubbing, cyanosis or edema; 2+ radial, ulnar and  brachial pulses bilaterally; 2+ right femoral, mildly diminished dorsalis pedis and posterior tibialis pulses; 2+ left femoral, mildly diminished dorsalis pedis and posterior tibialis; no subclavian or femoral bruits  Erythema of both feet  Small 2x4 mm areas of necrosis on plantar aspect of right great toe tip unchanged, medial right great toe and dorsum of right 4-5th toe, but good pulses and warm feet.  Neurological: grossly nonfocal   LABS  CBC  Recent Labs  06/30/14 2210  WBC 11.2*  NEUTROABS 8.7*  HGB 14.0  HCT 41.3  MCV 90.8  PLT 251   Basic Metabolic  Panel  Recent Labs  16/10/96 2210 07/01/14 0420 07/02/14 0458  NA 136*  --  136*  K 4.3  --  3.0*  CL 93*  --  91*  CO2 29  --  28  GLUCOSE 151*  --  165*  BUN 18  --  33*  CREATININE 1.03  --  0.98  CALCIUM 10.9*  --  10.7*  MG  --  1.5  --    Liver Function Tests  Recent Labs  06/30/14 2210 07/02/14 0458  AST 32 36  ALT 15 16  ALKPHOS 60 54  BILITOT 0.4 0.4  PROT 6.9 6.5  ALBUMIN 4.0 3.6    Recent Labs  07/01/14 0810  LIPASE 24  AMYLASE 36   Cardiac Enzymes  Recent Labs  07/01/14 0810 07/01/14 1405 07/02/14 0458  TROPONINI 1.63* 1.81* 1.04*   Radiology Studies Imaging results have been reviewed and Dg Chest Port 1 View  07/01/2014   CLINICAL DATA:  Respiratory distress.  EXAM: PORTABLE CHEST - 1 VIEW  COMPARISON:  06/30/2014.  FINDINGS: Heart size upper limits of normal for projection. Emphysema. Diffuse interstitial prominence is chronic. No airspace disease. Tortuous atherosclerotic aorta. Calcifications are present in the thoracic inlet, unchanged from prior. Monitoring leads project over the chest.  IMPRESSION: No acute cardiopulmonary disease. Emphysema and diffuse interstitial thickening.   Electronically Signed   By: Andreas Newport M.D.   On: 07/01/2014 20:20   Dg Chest Portable 1 View  06/30/2014   CLINICAL DATA:  Shortness of breath and chest pain.  EXAM: PORTABLE CHEST - 1 VIEW  COMPARISON:  None.  FINDINGS: The lungs are well-aerated. Chronic vascular congestion is noted. Minimal bibasilar opacities are thought to reflect atelectasis. No definite pleural effusion or pneumothorax is seen.  The cardiomediastinal silhouette is borderline normal in size. Scattered soft tissue calcification is noted overlying the right supraclavicular region, grossly unchanged from 2014. No acute osseous abnormalities are seen. A likely loose body is noted at the right glenohumeral joint.  IMPRESSION: Chronic vascular congestion noted. Minimal bibasilar airspace opacities  are thought to reflect atelectasis. Lungs otherwise grossly clear.   Electronically Signed   By: Roanna Raider M.D.   On: 06/30/2014 22:20   Dg Abd Portable 1v  07/01/2014   CLINICAL DATA:  Abdominal pain  EXAM: PORTABLE ABDOMEN - 1 VIEW  COMPARISON:  Abdominal radiographs 07/06/2013  FINDINGS: Imaged lung bases are clear. The bowel gas pattern is nonobstructive. No abdominal mass fact. No radiopaque urinary tract calculus visualized. Atherosclerotic calcification of the distal abdominal aorta and proximal common iliac arteries. Bones are osteopenic. Vertebral body height loss is suggested that L1, unchanged compared to prior studies.  IMPRESSION: Nonobstructive bowel gas pattern.  No acute findings.   Electronically Signed   By: Britta Mccreedy M.D.   On: 07/01/2014 12:23    TELE Sinus tachy, PACs  ECG Repeated  last PM, no change (trifascicular block, long PR, RBBB, LAFB)  ASSESSMENT AND PLAN  Overall, physical exam and Xrays are most consistent with a severe COPD exacerbation. Increased cardiac troponin I and BNP suggest acute CHF due to NSTEMI Echo may help elucidate the situation, but ultimately may need cardiac cath for clarification. If EF is lower, proceed to cath. Keep on heparin. Switch to a more selective beta1 blocker. Persistent hypercalcemia noted - was on thiazide at home. Check iPTH. She appears very ill.   Thurmon Fair, MD, [facility] [phone] office [phone] pager 07/02/2014 8:21 AM

## 2014-07-02 NOTE — Progress Notes (Signed)
ANTICOAGULATION CONSULT NOTE - Follow Up Consult  Pharmacy Consult for Heparin Indication: chest pain/ACS  Allergies  Allergen Reactions  . Ibuprofen Rash    Patient Measurements: Height:  (162.6 cm) Weight: 130 lb 11.7 oz (59.3 kg) IBW/kg (Calculated) : 54.7  Vital Signs: Temp: 98 F (36.7 C) (09/13 0734) Temp src: Oral (09/13 0734) BP: 119/85 mmHg (09/13 0734) Pulse Rate: 116 (09/13 0734)  Labs:  Recent Labs  06/30/14 2210  07/01/14 0810 07/01/14 1405 07/01/14 1910 07/02/14 0458 07/02/14 1110  HGB 14.0  --   --   --   --   --   --   HCT 41.3  --   --   --   --   --   --   PLT 251  --   --   --   --   --   --   HEPARINUNFRC  --   --  0.67  --  0.65  --  0.83*  CREATININE 1.03  --   --   --   --  0.98  --   TROPONINI 2.59*  < > 1.63* 1.81*  --  1.04*  --   < > = values in this interval not displayed.  Estimated Creatinine Clearance: 36.2 ml/min (by C-G formula based on Cr of 0.98).  Medications: Heparin IV 1000 units/hr  Assessment: 80 yo female who presented with ACS found to have NSTEMI with troponin 2.59. Pharmacy consulted for heparin. HL therapeutic x 2 on 9/12 (0.67 and 0.65). Daily HL on 9/13 back at 0.83. Will decrease by 2 units/kg/hr and recheck HL in 8 hours given age >71. Hgb and plt stable, no s/sx of bleeding.  Goal of Therapy:  Heparin level 0.3-0.7 units/ml Monitor platelets by anticoagulation protocol: Yes   Plan:  Decrease heparin to 900 units/hr HL at 2100 tonight Continue to monitor for s/sx of bleeding, plt, and Hgb  Megan E. Supple, Pharm.D Clinical Pharmacy Resident Pager: [phone] 07/02/2014 11:59 AM

## 2014-07-02 NOTE — Progress Notes (Signed)
ANTICOAGULATION CONSULT NOTE - Follow Up Consult  Pharmacy Consult for heparin Indication: NSTEMI  Labs:  Recent Labs  06/30/14 2210  07/01/14 0810 07/01/14 1405 07/01/14 1910 07/02/14 0458 07/02/14 1110 07/02/14 2200  HGB 14.0  --   --   --   --   --   --   --   HCT 41.3  --   --   --   --   --   --   --   PLT 251  --   --   --   --   --   --   --   HEPARINUNFRC  --   < > 0.67  --  0.65  --  0.83* 0.62  CREATININE 1.03  --   --   --   --  0.98  --   --   TROPONINI 2.59*  < > 1.63* 1.81*  --  1.04*  --   --   < > = values in this interval not displayed.   Assessment/Plan: 80yo female now therapeutic on heparin after rate decrease.  Will continue gtt at current rate and confirm stable with am labs.  Vernard Gambles, PharmD, BCPS  07/02/2014,11:03 PM

## 2014-07-03 DIAGNOSIS — I5041 Acute combined systolic (congestive) and diastolic (congestive) heart failure: Principal | ICD-10-CM | POA: Diagnosis present

## 2014-07-03 DIAGNOSIS — J9620 Acute and chronic respiratory failure, unspecified whether with hypoxia or hypercapnia: Secondary | ICD-10-CM | POA: Diagnosis present

## 2014-07-03 DIAGNOSIS — F17200 Nicotine dependence, unspecified, uncomplicated: Secondary | ICD-10-CM | POA: Diagnosis present

## 2014-07-03 DIAGNOSIS — I5181 Takotsubo syndrome: Secondary | ICD-10-CM

## 2014-07-03 DIAGNOSIS — I5189 Other ill-defined heart diseases: Secondary | ICD-10-CM | POA: Diagnosis present

## 2014-07-03 DIAGNOSIS — I519 Heart disease, unspecified: Secondary | ICD-10-CM

## 2014-07-03 DIAGNOSIS — I200 Unstable angina: Secondary | ICD-10-CM

## 2014-07-03 DIAGNOSIS — I10 Essential (primary) hypertension: Secondary | ICD-10-CM

## 2014-07-03 DIAGNOSIS — I255 Ischemic cardiomyopathy: Secondary | ICD-10-CM | POA: Diagnosis present

## 2014-07-03 DIAGNOSIS — I2583 Coronary atherosclerosis due to lipid rich plaque: Secondary | ICD-10-CM

## 2014-07-03 DIAGNOSIS — I2510 Atherosclerotic heart disease of native coronary artery without angina pectoris: Secondary | ICD-10-CM | POA: Diagnosis present

## 2014-07-03 DIAGNOSIS — I4510 Unspecified right bundle-branch block: Secondary | ICD-10-CM | POA: Diagnosis present

## 2014-07-03 DIAGNOSIS — J441 Chronic obstructive pulmonary disease with (acute) exacerbation: Secondary | ICD-10-CM | POA: Diagnosis present

## 2014-07-03 LAB — GLUCOSE, CAPILLARY
GLUCOSE-CAPILLARY: 146 mg/dL — AB (ref 70–99)
Glucose-Capillary: 147 mg/dL — ABNORMAL HIGH (ref 70–99)

## 2014-07-03 LAB — BASIC METABOLIC PANEL
Anion gap: 12 (ref 5–15)
BUN: 52 mg/dL — AB (ref 6–23)
CO2: 32 mEq/L (ref 19–32)
Calcium: 10.9 mg/dL — ABNORMAL HIGH (ref 8.4–10.5)
Chloride: 97 mEq/L (ref 96–112)
Creatinine, Ser: 1.07 mg/dL (ref 0.50–1.10)
GFR calc Af Amer: 53 mL/min — ABNORMAL LOW (ref >90)
GFR calc non Af Amer: 46 mL/min — ABNORMAL LOW (ref >90)
GLUCOSE: 156 mg/dL — AB (ref 70–99)
Potassium: 4.5 mEq/L (ref 3.7–5.3)
Sodium: 141 mEq/L (ref 137–147)

## 2014-07-03 LAB — BLOOD GAS, ARTERIAL
Acid-Base Excess: 2.3 mmol/L — ABNORMAL HIGH (ref 0.0–2.0)
BICARBONATE: 26.7 meq/L — AB (ref 20.0–24.0)
Drawn by: 13898
O2 Content: 4 L/min
O2 Saturation: 87 %
PATIENT TEMPERATURE: 98.4
PH ART: 7.403 (ref 7.350–7.450)
TCO2: 28 mmol/L (ref 0–100)
pCO2 arterial: 43.5 mmHg (ref 35.0–45.0)
pO2, Arterial: 52.1 mmHg — ABNORMAL LOW (ref 80.0–100.0)

## 2014-07-03 LAB — TYPE AND SCREEN
ABO/RH(D): B POS
ANTIBODY SCREEN: NEGATIVE

## 2014-07-03 LAB — PARATHYROID HORMONE, INTACT (NO CA): PTH: 158 pg/mL — ABNORMAL HIGH (ref 14–64)

## 2014-07-03 LAB — HEMOGLOBIN AND HEMATOCRIT, BLOOD
HCT: 38.6 % (ref 36.0–46.0)
HCT: 39.6 % (ref 36.0–46.0)
Hemoglobin: 13.2 g/dL (ref 12.0–15.0)
Hemoglobin: 13.2 g/dL (ref 12.0–15.0)

## 2014-07-03 LAB — ABO/RH: ABO/RH(D): B POS

## 2014-07-03 LAB — OCCULT BLOOD X 1 CARD TO LAB, STOOL: Fecal Occult Bld: POSITIVE — AB

## 2014-07-03 LAB — HEPARIN LEVEL (UNFRACTIONATED): Heparin Unfractionated: 0.61 IU/mL (ref 0.30–0.70)

## 2014-07-03 MED ORDER — WHITE PETROLATUM GEL
Status: AC
  Administered 2014-07-03: 1 via TOPICAL
  Filled 2014-07-03: qty 5

## 2014-07-03 MED ORDER — TIOTROPIUM BROMIDE MONOHYDRATE 18 MCG IN CAPS
18.0000 ug | ORAL_CAPSULE | Freq: Every day | RESPIRATORY_TRACT | Status: DC
  Filled 2014-07-03: qty 5

## 2014-07-03 MED ORDER — PANTOPRAZOLE SODIUM 40 MG IV SOLR
40.0000 mg | INTRAVENOUS | Status: DC
  Administered 2014-07-03: 40 mg via INTRAVENOUS
  Filled 2014-07-03 (×2): qty 40

## 2014-07-03 MED ORDER — TIOTROPIUM BROMIDE MONOHYDRATE 18 MCG IN CAPS
18.0000 ug | ORAL_CAPSULE | Freq: Every day | RESPIRATORY_TRACT | Status: DC
  Administered 2014-07-04 – 2014-07-05 (×2): 18 ug via RESPIRATORY_TRACT
  Filled 2014-07-03: qty 5

## 2014-07-03 MED ORDER — FUROSEMIDE 10 MG/ML IJ SOLN
40.0000 mg | Freq: Every day | INTRAMUSCULAR | Status: DC
  Administered 2014-07-04: 40 mg via INTRAVENOUS
  Filled 2014-07-03: qty 4

## 2014-07-03 MED ORDER — INSULIN ASPART 100 UNIT/ML ~~LOC~~ SOLN
0.0000 [IU] | Freq: Three times a day (TID) | SUBCUTANEOUS | Status: DC
  Administered 2014-07-03: 1 [IU] via SUBCUTANEOUS
  Administered 2014-07-04 (×3): 2 [IU] via SUBCUTANEOUS
  Administered 2014-07-05: 1 [IU] via SUBCUTANEOUS
  Administered 2014-07-05: 2 [IU] via SUBCUTANEOUS
  Administered 2014-07-06 – 2014-07-07 (×4): 1 [IU] via SUBCUTANEOUS
  Administered 2014-07-07: 2 [IU] via SUBCUTANEOUS

## 2014-07-03 MED ORDER — PANTOPRAZOLE SODIUM 40 MG IV SOLR
40.0000 mg | Freq: Two times a day (BID) | INTRAVENOUS | Status: DC
Start: 2014-07-03 — End: 2014-07-05
  Administered 2014-07-03 – 2014-07-04 (×3): 40 mg via INTRAVENOUS
  Filled 2014-07-03 (×5): qty 40

## 2014-07-03 MED ORDER — LOSARTAN POTASSIUM 25 MG PO TABS
25.0000 mg | ORAL_TABLET | Freq: Every day | ORAL | Status: DC
  Administered 2014-07-04 – 2014-07-07 (×4): 25 mg via ORAL
  Filled 2014-07-03 (×4): qty 1

## 2014-07-03 MED ORDER — POTASSIUM CHLORIDE CRYS ER 20 MEQ PO TBCR
20.0000 meq | EXTENDED_RELEASE_TABLET | Freq: Once | ORAL | Status: AC
  Administered 2014-07-04: 20 meq via ORAL
  Filled 2014-07-03: qty 1

## 2014-07-03 MED ORDER — INSULIN ASPART 100 UNIT/ML ~~LOC~~ SOLN: 0.0000 [IU] | Freq: Every day | SUBCUTANEOUS | Status: DC

## 2014-07-03 NOTE — Progress Notes (Signed)
Received verbal order from [facility] PA to stop heparin gtt until results of H/H.

## 2014-07-03 NOTE — Progress Notes (Addendum)
    Subjective:  On bed pan- still SOB.  Objective:  Vital Signs in the last 24 hours: Temp:  [97.8 F (36.6 C)-98.8 F (37.1 C)] 98 F (36.7 C) (09/14 0700) Pulse Rate:  [78-110] 107 (09/14 0920) Resp:  [18-34] 21 (09/14 0700) BP: (92-123)/(45-67) 123/61 mmHg (09/14 0920) SpO2:  [96 %-98 %] 96 % (09/14 0700) Weight:  [133 lb 13.1 oz (60.7 kg)] 133 lb 13.1 oz (60.7 kg) (09/14 0331)  Intake/Output from previous day:  Intake/Output Summary (Last 24 hours) at 07/03/14 1038 Last data filed at 07/03/14 1000  Gross per 24 hour  Intake    659 ml  Output   2600 ml  Net  -1941 ml    Physical Exam: General appearance: alert, cooperative, cachectic, no distress and frail Lungs: diffuse rhonchi, scattered exp wheezing Heart: regular rate and rhythm and increased rate   Rate: 110  Rhythm: sinus tachycardia and RBBB  Lab Results:  Recent Labs  06/30/14 2210  WBC 11.2*  HGB 14.0  PLT 251    Recent Labs  07/02/14 0458 07/03/14 0550  NA 136* 141  K 3.0* 4.5  CL 91* 97  CO2 28 32  GLUCOSE 165* 156*  BUN 33* 52*  CREATININE 0.98 1.07    Recent Labs  07/01/14 1405 07/02/14 0458  TROPONINI 1.81* 1.04*   No results found for this basename: INR,  in the last 72 hours  Imaging: Imaging results have been reviewed  Cardiac Studies:  Assessment/Plan:  80 y/o female with a history of an acute COPD exacerbation, PAF, and Takotsubo event  in Feb 2014. She did have elevated enzymes but no obstructive CAD at cath. She had PAF during that admission briefly treated with Amiodarone. She was not anticoagulated then.  She was admitted 07/01/14 with increasing dyspnea for several days. Her Troponin has been positive pk 1.63. Echo shows an EF of 40-45% with grade 2 diastolic dysfunction. She has also had abdominal pain and dark stools, guaiac this am is pending. HGb on admission was stable- 14. Plan is to consider cath when appropriate-(see Dr Croitoru's note 07/02/13).      Principal Problem:   Acute on chronic respiratory failure Active Problems:   NSTEMI (non-ST elevated myocardial infarction)   ACS (acute coronary syndrome)   Acute combined systolic and diastolic heart failure   COPD exacerbation   History of Takotsubo syndrome-Feb 2014   Diastolic dysfunction- grade 2 by echo   Cardiomyopathy, ? ischemic- EF 40% with WMA   CAD- mild CAD Feb 2014   Hx of PAF-Feb 2014   Hypotension   Abdominal pain   RBBB    PLAN:  Check stool guaiac. Decrease K+ supplement. Back off diuretics-(BUN up to 52 today). Change ACE to an ARB with severe COPD.   Corine Shelter PA-C Beeper [phone] 07/03/2014, 10:38 AM   Note- stool guaiac was positive. Check Hgb/Hct. Hold Heparin till labs are back. Add Protonix IV  Personally seen and examined. Agree with above.  Consult [facility] hospitalist   Monitor for acute GI bleed. Repeating H/H. Stopped heparin. Continue ASA.  BUN increase sometimes seen in GIB. Could also be overdiuresis. Wheeze continues. Will ask assistance from hospitalist for COPD care. Agree with ARB.  Troponin elevation  - possible demand ischemia given low rise and fall. EF 45%. Looks similar to prior ECHO in setting of Takesubo I would be hesitant to proceed with cardiac cath. For now medical management.   Donato Schultz, MD

## 2014-07-03 NOTE — Progress Notes (Addendum)
Pt noted to have black tarry stool. MD notified. Sample send down to lab for hemoccult testing, awaiting results. No s/s of acute distress noted, VS stable. Will continue to monitor pt.  Stool positive for blood. Cardiology PA paged, awaiting call back.  [facility] PA notified of blood in stool, he is placing orders to check pt's hemoglobin. Will continue to monitor pt. No s/s of acute distress noted at this time.

## 2014-07-03 NOTE — Progress Notes (Addendum)
ANTICOAGULATION CONSULT NOTE - Follow Up Consult  Pharmacy Consult for Heparin Indication: chest pain/ACS  Allergies  Allergen Reactions  . Ibuprofen Rash    Patient Measurements: Height:  (162.6 cm) Weight: 133 lb 13.1 oz (60.7 kg) IBW/kg (Calculated) : 54.7  Vital Signs: Temp: 98 F (36.7 C) (09/14 0700) Temp src: Oral (09/14 0700) BP: 123/61 mmHg (09/14 0920) Pulse Rate: 107 (09/14 0920)  Labs:  Recent Labs  06/30/14 2210  07/01/14 0810 07/01/14 1405  07/02/14 0458 07/02/14 1110 07/02/14 2200 07/03/14 0550  HGB 14.0  --   --   --   --   --   --   --   --   HCT 41.3  --   --   --   --   --   --   --   --   PLT 251  --   --   --   --   --   --   --   --   HEPARINUNFRC  --   --  0.67  --   < >  --  0.83* 0.62 0.61  CREATININE 1.03  --   --   --   --  0.98  --   --  1.07  TROPONINI 2.59*  < > 1.63* 1.81*  --  1.04*  --   --   --   < > = values in this interval not displayed.  Estimated Creatinine Clearance: 33.2 ml/min (by C-G formula based on Cr of 1.07).  Medications: Heparin IV 900 units/hr  Assessment: 80 yo female who presented with ACS found to have NSTEMI with troponin 2.59. Pharmacy consulted for heparin. Heparin level is therapeutic on 900 units/hr.  ECHO shows EF 40-45%.  Goal of Therapy:  Heparin level 0.3-0.7 units/ml Monitor platelets by anticoagulation protocol: Yes   Plan:  Continue heparin to 900 units/hr Daily heparin level and CBC. Follow up plans for cardiac cath.  [employer], Pharm.D., BCPS Clinical Pharmacist Pager [phone] 07/03/2014 10:29 AM

## 2014-07-03 NOTE — Consult Note (Signed)
Patient Demographics  Erica Howard, is a 80 y.o. female   MRN: [id number]   DOB - [birth year]  Admit Date - 06/30/2014    Outpatient Primary MD for the patient is Catalina Pizza, MD  Consult requested in the Hospital by Quintella Reichert, MD, On 07/03/2014    Reason for consult COPD exacerbation, Hemoccult positive stool   With History of -  Past Medical History  Diagnosis Date  . COPD (chronic obstructive pulmonary disease)   . Peripheral vascular disease   . Hypertension   . Arthritis   . Myocardial infarction 11/20/2012    Non obstructive CAD  . CHF (congestive heart failure) 11/20/2012    EF 35% Takatsubo.  Improved to 60%  . Tobacco abuse       Past Surgical History  Procedure Laterality Date  . No past surgeries      in for   Admitted by cardiology for chest pain and elevated troponin   HPI  Erica Howard  is a 80 y.o. female, with history of COPD, ongoing smoker counseled to quit, PAT, hypertension, chronic systolic and diastolic heart failure baseline EF on this echo 40-45%, nonobstructive CAD on cath 11-2012, paroxysmal atrial fibrillation, Takatsubo cardiomyopathy he diagnosed last year. She initially presented to [facility] ER for shortness of breath, workup at any [facility] revealed a troponin of 2.5, elevated pro BNP, bilateral wheezing. She was then transferred and admitted by cardiology service to [facility] cone stepdown. She had initially been placed on IV heparin along with aspirin, during her routine workup it was noted that her stool was dark and Hemoccult positive, she also had evidence of COPD exacerbation, she was started on IV steroids, IV PPI, heparin and aspirin were stopped.    Hospitalist service was requested to assist in the management of the patient for COPD exacerbation and heme positive  stools.     Review of Systems    In addition to the HPI above,   No Fever-chills, No Headache, No changes with Vision or hearing, No problems swallowing food or Liquids, No Chest pain, Cough, positive for Shortness of Breath, No Abdominal pain, No Nausea or Vommitting, Bowel movements are regular, No Blood in stool or Urine, but apparently had a dark black stool in the hospital today No dysuria, No new skin rashes or bruises, No new joints pains-aches,  No new weakness, tingling, numbness in any extremity, No recent weight gain or loss, No polyuria, polydypsia or polyphagia, No significant Mental Stressors.  A full 10 point Review of Systems was done, except as stated above, all other Review of Systems were negative.   Social History History  Substance Use Topics  . Smoking status: Current Some Day Smoker -- 1.00 packs/day for 60 years    Types: Cigarettes    Last Attempt to Quit: 11/20/2012  . Smokeless tobacco: Never Used  . Alcohol Use: No      Family History Family History  Problem Relation Age of Onset  . Sudden death  Father 43    Possible MI  . CAD Mother 52    MI      Prior to Admission medications   Medication Sig Start Date End Date Taking? Authorizing Provider  albuterol (VENTOLIN HFA) 108 (90 BASE) MCG/ACT inhaler Inhale 1 puff into the lungs as needed for wheezing or shortness of breath.   Yes Historical Provider, MD  ALPRAZolam (XANAX) 0.25 MG tablet Take 0.25 mg by mouth 2 (two) times daily as needed for anxiety. 11/26/12  Yes Kathlen Mody, MD  aspirin EC 81 MG tablet Take 81 mg by mouth every morning.   Yes Historical Provider, MD  carvedilol (COREG) 6.25 MG tablet Take 1.5 tablets (9.375 mg total) by mouth 2 (two) times daily. 01/09/14  Yes [name], MD  enalapril-hydrochlorothiazide (VASERETIC) 10-25 MG per tablet Take 1 tablet by mouth every morning.    Yes Historical Provider, MD  fexofenadine (ALLEGRA) 180 MG tablet Take 180 mg by mouth  every morning.   Yes Historical Provider, MD  furosemide (LASIX) 20 MG tablet Take 1-2 tablets (20-40 mg total) by mouth as directed. 01/03/14  Yes [name], MD  HYDROcodone-acetaminophen (NORCO) 10-325 MG per tablet Take 1 tablet by mouth every 4 (four) hours as needed for pain.    Yes Historical Provider, MD  Ipratropium-Albuterol (COMBIVENT RESPIMAT) 20-100 MCG/ACT AERS respimat Inhale 1 puff into the lungs daily.   Yes Historical Provider, MD  Multiple Vitamins-Minerals (SENIOR MULTIVITAMIN PLUS) TABS Take 1 tablet by mouth every morning.    Yes Historical Provider, MD  polyethylene glycol (MIRALAX / GLYCOLAX) packet Take 17 g by mouth at bedtime.   Yes Historical Provider, MD  potassium chloride (KLOR-CON) 8 MEQ tablet Take 8 mEq by mouth every morning.  11/26/12  Yes Kathlen Mody, MD  Probiotic Product (PROBIOTIC DAILY PO) Take 1 capsule by mouth every morning.    Yes Historical Provider, MD  simvastatin (ZOCOR) 20 MG tablet Take 20 mg by mouth every evening.   Yes Historical Provider, MD  triamcinolone cream (KENALOG) 0.1 % Apply 1 application topically daily as needed (for rash).  01/21/13  Yes Historical Provider, MD  cyclobenzaprine (FLEXERIL) 5 MG tablet Take 5 mg by mouth at bedtime as needed and may repeat dose one time if needed for muscle spasms.     Historical Provider, MD  nitroGLYCERIN (NITROSTAT) 0.4 MG SL tablet Place 1 tablet (0.4 mg total) under the tongue every 5 (five) minutes as needed for chest pain. 03/18/13   [name], MD  ondansetron (ZOFRAN) 4 MG tablet Take 1 tablet (4 mg total) by mouth 2 (two) times daily as needed for nausea or vomiting. 01/03/14   [name], MD    Anti-infectives   None      Scheduled Meds: . antiseptic oral rinse  7 mL Mouth Rinse BID  . aspirin EC  81 mg Oral Daily  . [START ON 07/04/2014] furosemide  40 mg Intravenous Daily  . levalbuterol  1.25 mg Nebulization 4 times per day  . [START ON 07/04/2014] losartan  25 mg Oral Daily   . methylPREDNISolone (SOLU-MEDROL) injection  60 mg Intravenous Q12H  . metoprolol tartrate  12.5 mg Oral BID  . multivitamin with minerals  1 tablet Oral q morning - 10a  . pantoprazole (PROTONIX) IV  40 mg Intravenous Q12H  . pneumococcal 23 valent vaccine  0.5 mL Intramuscular Tomorrow-1000  . [START ON 07/04/2014] potassium chloride  20 mEq Oral Once  . simvastatin  20 mg Oral  QPM  . sodium chloride  3 mL Intravenous Q12H  . tiotropium  18 mcg Inhalation Daily   Continuous Infusions:  PRN Meds:.sodium chloride, acetaminophen, ALPRAZolam, bisacodyl, cyclobenzaprine, HYDROcodone-acetaminophen, nitroGLYCERIN, ondansetron (ZOFRAN) IV, sodium chloride  Allergies  Allergen Reactions  . Ibuprofen Rash    Physical Exam  Vitals  Blood pressure 117/55, pulse 81, temperature 98 F (36.7 C), temperature source Oral, resp. rate 30, height  (1.626 m), weight 60.7 kg (133 lb 13.1 oz), SpO2 95.00%.   1. General frail elderly white female lying in bed in NAD,     2. Normal affect and insight, Not Suicidal or Homicidal, Awake Alert, Oriented X 3.  3. No F.N deficits, ALL C.Nerves Intact, Strength 5/5 all 4 extremities, Sensation intact all 4 extremities, Plantars down going.  4. Ears and Eyes appear Normal, Conjunctivae clear, PERRLA. Moist Oral Mucosa.  5. Supple Neck, No JVD, No cervical lymphadenopathy appriciated, No Carotid Bruits.  6. Symmetrical Chest wall movement, moderate air movement bilaterally, diffuse bilateral wheezing  7. RRR, No Gallops, Rubs or Murmurs, No Parasternal Heave.  8. Positive Bowel Sounds, Abdomen Soft, No tenderness, No organomegaly appriciated,No rebound -guarding or rigidity.  9.  No Cyanosis, Normal Skin Turgor, No Skin Rash or Bruise.  10. Good muscle tone,  joints appear normal , no effusions, Normal ROM.  11. No Palpable Lymph Nodes in Neck or Axillae     Data Review  CBC  Recent Labs Lab 06/30/14 2210  WBC 11.2*  HGB 14.0  HCT  41.3  PLT 251  MCV 90.8  MCH 30.8  MCHC 33.9  RDW 13.7  LYMPHSABS 1.2  MONOABS 1.1*  EOSABS 0.2  BASOSABS 0.1   ------------------------------------------------------------------------------------------------------------------  Chemistries   Recent Labs Lab 06/30/14 2210 07/01/14 0420 07/02/14 0458 07/03/14 0550  NA 136*  --  136* 141  K 4.3  --  3.0* 4.5  CL 93*  --  91* 97  CO2 29  --  28 32  GLUCOSE 151*  --  165* 156*  BUN 18  --  33* 52*  CREATININE 1.03  --  0.98 1.07  CALCIUM 10.9*  --  10.7* 10.9*  MG  --  1.5  --   --   AST 32  --  36  --   ALT 15  --  16  --   ALKPHOS 60  --  54  --   BILITOT 0.4  --  0.4  --    ------------------------------------------------------------------------------------------------------------------ estimated creatinine clearance is 33.2 ml/min (by C-G formula based on Cr of 1.07). ------------------------------------------------------------------------------------------------------------------ No results found for this basename: TSH, T4TOTAL, FREET3, T3FREE, THYROIDAB,  in the last 72 hours   Coagulation profile No results found for this basename: INR, PROTIME,  in the last 168 hours ------------------------------------------------------------------------------------------------------------------- No results found for this basename: DDIMER,  in the last 72 hours -------------------------------------------------------------------------------------------------------------------  Cardiac Enzymes  Recent Labs Lab 07/01/14 0810 07/01/14 1405 07/02/14 0458  TROPONINI 1.63* 1.81* 1.04*   ------------------------------------------------------------------------------------------------------------------ No components found with this basename: POCBNP,    ---------------------------------------------------------------------------------------------------------------  Urinalysis    Component Value Date/Time   COLORURINE YELLOW  07/01/2014 0023   APPEARANCEUR CLEAR 07/01/2014 0023   LABSPEC 1.015 07/01/2014 0023   PHURINE 5.5 07/01/2014 0023   GLUCOSEU NEGATIVE 07/01/2014 0023   HGBUR TRACE* 07/01/2014 0023   BILIRUBINUR NEGATIVE 07/01/2014 0023   KETONESUR NEGATIVE 07/01/2014 0023   PROTEINUR NEGATIVE 07/01/2014 0023   UROBILINOGEN 0.2 07/01/2014 0023   NITRITE NEGATIVE 07/01/2014 0023  LEUKOCYTESUR NEGATIVE 07/01/2014 0023     Imaging results:   Dg Chest Port 1 View  07/01/2014   CLINICAL DATA:  Respiratory distress.  EXAM: PORTABLE CHEST - 1 VIEW  COMPARISON:  06/30/2014.  FINDINGS: Heart size upper limits of normal for projection. Emphysema. Diffuse interstitial prominence is chronic. No airspace disease. Tortuous atherosclerotic aorta. Calcifications are present in the thoracic inlet, unchanged from prior. Monitoring leads project over the chest.  IMPRESSION: No acute cardiopulmonary disease. Emphysema and diffuse interstitial thickening.   Electronically Signed   By: Andreas Newport M.D.   On: 07/01/2014 20:20    My personal review of EKG: Rhythm S.Tach,RBBB, Rate  120s /min, non specific ST changes    Assessment & Plan   1. Elevated troponin in a patient with Takatsubo cardiomyopathy, nonobstructive CAD, chronic combined systolic and diastolic heart failure with EF now between 40-45%. She is under cardiology service, will defer management of heart issues to the primary service. EKG does not show any acute ST changes, she is currently on Lopressor, statin, aspirin, ARB, Lasix and appears to be doing well from cardiac standpoint. She appears to be a poor candidate for cardiac catheterization. Medical management per cardiology.    2. COPD exacerbation. She was counseled to quit smoking, she does not use home oxygen, she is on Solu-Medrol IV twice a day which will be continued, will add Spiriva, flutter valve, encourage her to sit up in the recliner, albuterol nebulizer treatments as needed. Taper steroids based on  clinical improvement.    3. Elevated CBGs. No history of diabetes mellitus, likely secondary to steroid exposure, check A1c place on sliding scale for now.    4. Smoking. Counseled to quit    5. Mild pulmonary hypertension noted on echogram this admission. Secondary to smoking and COPD, outpatient pulmonary followup.    6. Heme positive stool with mildly elevated BUN, she is no upper abdominal discomfort or nausea. BUN could have been elevated due to ongoing diuretic use however a small upper GI bleed cannot be ruled out. I discussed with Dr. Serena Croissant cardiologist, for now plan is to monitor H&H, type screen, IV PPI twice a day, discontinue heparin drip, if H&H drops will stop aspirin as well and consult GI at that time. Patient has had no previous GI workup was scoped.    7. Paroxysmal A. fib. Mention in the chart. Not on anticoagulation, defer to cardiology. Currently in sinus.     DVT Prophylaxis  SCDs   AM Labs Ordered, also please review Full Orders  Family Communication: Plan discussed with patient and Dr Anne Fu   Thank you for the consult, we will follow the patient with you in the Hospital.   Susa Raring K M.D on 07/03/2014 at 12:50 PM  Between 7am to 7pm - Pager - [phone]  After 7pm go to [email or web address] - password [email or web address]  And look for the night coverage person covering me after hours   Thank you for the consult, we will follow the patient with you in the Hospital.   [facility] Office  [phone]   **Disclaimer: This note may have been dictated with voice recognition software. Similar sounding words can inadvertently be transcribed and this note may contain transcription errors which may not have been corrected upon publication of note.**

## 2014-07-03 NOTE — Care Management Note (Signed)
    Page 1 of 1   07/07/2014     2:34:32 PM CARE MANAGEMENT NOTE 07/07/2014  Patient:  Erica Howard, Erica Howard   Account Number:  [id number]  Date Initiated:  07/03/2014  Documentation initiated by:  GRAVES-BIGELOW,BRENDA  Subjective/Objective Assessment:   Pt admitted for NStemi and  Acute on chronic respiratory failure. 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     Action/Plan:   CM will continue to monitor for disposition needs. Pt will need PT/OT consult once stable.   Anticipated DC Date:  07/06/2014   Anticipated DC Plan:  SKILLED NURSING FACILITY      DC Planning Services  CM consult      Choice offered to / List presented to:             Status of service:  Completed, signed off Medicare Important Message given?  YES (If response is "NO", the following Medicare IM given date fields will be blank) Date Medicare IM given:  07/03/2014 Medicare IM given by:  GRAVES-BIGELOW,BRENDA Date Additional Medicare IM given:  07/06/2014 Additional Medicare IM given by:  Taten Merrow  Discharge Disposition:  SKILLED NURSING FACILITY  Per UR Regulation:  Reviewed for med. necessity/level of care/duration of stay  If discussed at Long Length of Stay Meetings, dates discussed:   07/06/2014    Comments:  07/07/14 [name], RN, BSN [phone] Pt discharged to SNF today, per CSW arrangements.

## 2014-07-04 DIAGNOSIS — R7989 Other specified abnormal findings of blood chemistry: Secondary | ICD-10-CM | POA: Diagnosis present

## 2014-07-04 DIAGNOSIS — R195 Other fecal abnormalities: Secondary | ICD-10-CM | POA: Diagnosis present

## 2014-07-04 DIAGNOSIS — I248 Other forms of acute ischemic heart disease: Secondary | ICD-10-CM | POA: Diagnosis present

## 2014-07-04 LAB — HEMOGLOBIN A1C
HEMOGLOBIN A1C: 6.1 % — AB (ref <5.7)
Mean Plasma Glucose: 128 mg/dL — ABNORMAL HIGH (ref <117)

## 2014-07-04 LAB — GLUCOSE, CAPILLARY
GLUCOSE-CAPILLARY: 163 mg/dL — AB (ref 70–99)
GLUCOSE-CAPILLARY: 133 mg/dL — AB (ref 70–99)
Glucose-Capillary: 152 mg/dL — ABNORMAL HIGH (ref 70–99)
Glucose-Capillary: 166 mg/dL — ABNORMAL HIGH (ref 70–99)

## 2014-07-04 LAB — BASIC METABOLIC PANEL
ANION GAP: 12 (ref 5–15)
BUN: 59 mg/dL — ABNORMAL HIGH (ref 6–23)
CHLORIDE: 100 meq/L (ref 96–112)
CO2: 31 meq/L (ref 19–32)
Calcium: 10.7 mg/dL — ABNORMAL HIGH (ref 8.4–10.5)
Creatinine, Ser: 1.07 mg/dL (ref 0.50–1.10)
GFR calc Af Amer: 53 mL/min — ABNORMAL LOW (ref >90)
GFR calc non Af Amer: 46 mL/min — ABNORMAL LOW (ref >90)
Glucose, Bld: 147 mg/dL — ABNORMAL HIGH (ref 70–99)
Potassium: 4.2 mEq/L (ref 3.7–5.3)
SODIUM: 143 meq/L (ref 137–147)

## 2014-07-04 LAB — CBC
HCT: 37.7 % (ref 36.0–46.0)
Hemoglobin: 12.7 g/dL (ref 12.0–15.0)
MCH: 30 pg (ref 26.0–34.0)
MCHC: 33.7 g/dL (ref 30.0–36.0)
MCV: 88.9 fL (ref 78.0–100.0)
Platelets: 217 10*3/uL (ref 150–400)
RBC: 4.24 MIL/uL (ref 3.87–5.11)
RDW: 14.1 % (ref 11.5–15.5)
WBC: 13 10*3/uL — ABNORMAL HIGH (ref 4.0–10.5)

## 2014-07-04 LAB — T3: T3 TOTAL: 96.4 ng/dL (ref 80.0–204.0)

## 2014-07-04 LAB — PRO B NATRIURETIC PEPTIDE: PRO B NATRI PEPTIDE: 19784 pg/mL — AB (ref 0–450)

## 2014-07-04 LAB — TSH: TSH: 0.283 u[IU]/mL — ABNORMAL LOW (ref 0.350–4.500)

## 2014-07-04 MED ORDER — BISOPROLOL FUMARATE 5 MG PO TABS
5.0000 mg | ORAL_TABLET | Freq: Every day | ORAL | Status: DC
  Administered 2014-07-04 – 2014-07-07 (×4): 5 mg via ORAL
  Filled 2014-07-04 (×4): qty 1

## 2014-07-04 MED ORDER — ALBUTEROL SULFATE (2.5 MG/3ML) 0.083% IN NEBU
INHALATION_SOLUTION | RESPIRATORY_TRACT | Status: AC
Start: 2014-07-04 — End: 2014-07-05
  Filled 2014-07-04: qty 3

## 2014-07-04 MED ORDER — LEVALBUTEROL HCL 1.25 MG/0.5ML IN NEBU
1.2500 mg | INHALATION_SOLUTION | Freq: Three times a day (TID) | RESPIRATORY_TRACT | Status: DC
  Administered 2014-07-04 – 2014-07-05 (×4): 1.25 mg via RESPIRATORY_TRACT
  Filled 2014-07-04 (×7): qty 0.5

## 2014-07-04 MED ORDER — FUROSEMIDE 20 MG PO TABS
20.0000 mg | ORAL_TABLET | Freq: Every day | ORAL | Status: DC
  Filled 2014-07-04 (×2): qty 1

## 2014-07-04 MED ORDER — METHYLPREDNISOLONE SODIUM SUCC 40 MG IJ SOLR
30.0000 mg | Freq: Two times a day (BID) | INTRAMUSCULAR | Status: DC
  Administered 2014-07-04 – 2014-07-05 (×2): 30 mg via INTRAVENOUS
  Filled 2014-07-04 (×6): qty 0.75

## 2014-07-04 NOTE — Evaluation (Signed)
Physical Therapy Evaluation Patient Details Name: Erica Howard MRN: [id number] DOB: December 05, 1926 Today's Date: 07/04/2014   History of Present Illness  patient is a 80 y.o. female who presents for evaluation of shortness of breath.  She has hx of Atrial fibrillation and Takatsubo cardiomyopathy. Found to have NSTEMI, COPD and CHF exacerbations.  Clinical Impression  Patient demonstrates deficits in functional mobility as indicated below. Will benefit from continued skilled PT to address deficits and maximize function. Will see as indicated and progress as tolerated. 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 Under current conditions, patient will likely need ST SNF to improve functional status, however, if patient family is able to provide her physical support 24/7, may consider HHPT and 24/78 physical assist at discharge, although this therapist would be genuinely concerned regarding patient fall risk and self care ability.     Follow Up Recommendations SNF;Supervision/Assistance - 24 hour    Equipment Recommendations  None recommended by PT    Recommendations for Other Services       Precautions / Restrictions Precautions Precautions: Fall Restrictions Weight Bearing Restrictions: No      Mobility  Bed Mobility               General bed mobility comments: not assessed  Transfers Overall transfer level: Needs assistance Equipment used: Rolling walker (2 wheeled) Transfers: Sit to/from Stand Sit to Stand: Min assist         General transfer comment: VCs for safe hand placement, cues for positioning and assist for power up and stability (performed x2 from chair and 1 time from [facility].  Ambulation/Gait Ambulation/Gait assistance: Min guard;Min assist Ambulation Distance (Feet): 40 Feet Assistive device: Rolling walker (2 wheeled) (mod assist for one person HHA and no device) Gait Pattern/deviations: Decreased stride length;Shuffle;Trunk flexed;Step-to  pattern;Narrow base of support;Drifts right/left Gait velocity: significantly decreased with increased effort to ambulate Gait velocity interpretation: <1.8 ft/sec, indicative of risk for recurrent falls General Gait Details: patient with instability and multiple noted LOB when ambulating without device, improved stability with use of RW but still required assist. Ambulated on room air SpO2 decreased to 90%.  Stairs            Wheelchair Mobility    Modified Rankin (Stroke Patients Only)       Balance Overall balance assessment: Needs assistance Sitting-balance support: Feet supported Sitting balance-Leahy Scale: Fair Sitting balance - Comments: significant forward flexed posture   Standing balance support: Bilateral upper extremity supported;During functional activity Standing balance-Leahy Scale: Poor                               Pertinent Vitals/Pain Pain Assessment: No/denies pain    Home Living Family/patient expects to be discharged to:: Private residence Living Arrangements: Spouse/significant other Available Help at Discharge: Family;Available 24 hours/day Type of Home: House Home Access: Stairs to enter Entrance Stairs-Rails: Right Entrance Stairs-Number of Steps: 1 Home Layout: One level Home Equipment: Walker - 2 wheels;Cane - single point;Bedside commode;Shower seat      Prior Function Level of Independence: Independent               Hand Dominance   Dominant Hand: Right    Extremity/Trunk Assessment   Upper Extremity Assessment: Generalized weakness           Lower Extremity Assessment: Generalized weakness (significantly deconditioned )      Cervical / Trunk Assessment: Kyphotic  Communication  Communication: HOH  Cognition Arousal/Alertness: Awake/alert Behavior During Therapy: WFL for tasks assessed/performed Overall Cognitive Status: No family/caregiver present to determine baseline cognitive functioning                       General Comments  Extensively deconditioned    Exercises        Assessment/Plan    PT Assessment Patient needs continued PT services  PT Diagnosis Difficulty walking;Abnormality of gait;Generalized weakness   PT Problem List Decreased strength;Decreased range of motion;Decreased activity tolerance;Decreased balance;Decreased mobility;Decreased safety awareness;Cardiopulmonary status limiting activity  PT Treatment Interventions DME instruction;Stair training;Functional mobility training;Therapeutic activities;Therapeutic exercise;Balance training;Patient/family education   PT Goals (Current goals can be found in the Care Plan section) Acute Rehab PT Goals Patient Stated Goal: to eat my lunch PT Goal Formulation: With patient Time For Goal Achievement: 07/18/14 Potential to Achieve Goals: Fair    Frequency Min 3X/week   Barriers to discharge        Co-evaluation               End of Session Equipment Utilized During Treatment: Gait belt;Oxygen Activity Tolerance: Patient tolerated treatment well;Patient limited by fatigue Patient left: in chair;with call bell/phone within reach Nurse Communication: Mobility status         Time: 6962-9528 PT Time Calculation (min): 19 min   Charges:   PT Evaluation $Initial PT Evaluation Tier I: 1 Procedure PT Treatments $Therapeutic Activity: 8-22 mins   PT G CodesFabio Asa 07/04/2014, 1:49 PM Charlotte Crumb, PT DPT  [phone]

## 2014-07-04 NOTE — Progress Notes (Addendum)
Subjective:  Somewhat fatigued, in chair. Still with shortness of breath. Denies any chest pain  Objective:  Vital Signs in the last 24 hours: Temp:  [97.7 F (36.5 C)-98.5 F (36.9 C)] 97.7 F (36.5 C) (09/15 0740) Pulse Rate:  [78-107] 97 (09/15 0421) Resp:  [21-32] 26 (09/15 0421) BP: (113-127)/(54-65) 124/56 mmHg (09/15 0421) SpO2:  [92 %-99 %] 96 % (09/15 0924) Weight:  [122 lb 12.7 oz (55.7 kg)] 122 lb 12.7 oz (55.7 kg) (09/15 0900)  Intake/Output from previous day: 09/14 0701 - 09/15 0700 In: 120 [I.V.:120] Out: 1625 [Urine:1625]   Physical Exam: General: Elderly, frail in no acute distress. Head:  Normocephalic and atraumatic. Lungs: She wheezing bilaterally, poor air movement Heart: Mildly tachycardic  No murmur, rubs or gallops.  Abdomen: soft, non-tender, positive bowel sounds. Extremities: No clubbing or cyanosis. No edema. Neurologic: Alert somewhat tired in chair    Lab Results:  Recent Labs  07/03/14 1827 07/04/14 0116  WBC  --  13.0*  HGB 13.2 12.7  PLT  --  217    Recent Labs  07/03/14 0550 07/04/14 0116  NA 141 143  K 4.5 4.2  CL 97 100  CO2 32 31  GLUCOSE 156* 147*  BUN 52* 59*  CREATININE 1.07 1.07    Recent Labs  07/01/14 1405 07/02/14 0458  TROPONINI 1.81* 1.04*   Hepatic Function Panel  Recent Labs  07/02/14 0458  PROT 6.5  ALBUMIN 3.6  AST 36  ALT 16  ALKPHOS 54  BILITOT 0.4    Telemetry: Sinus rhythm, sinus tachycardia, no adverse arrhythmias Personally viewed.  Scheduled Meds: . antiseptic oral rinse  7 mL Mouth Rinse BID  . aspirin EC  81 mg Oral Daily  . bisoprolol  5 mg Oral Daily  . furosemide  40 mg Intravenous Daily  . furosemide  20 mg Oral Daily  . insulin aspart  0-5 Units Subcutaneous QHS  . insulin aspart  0-9 Units Subcutaneous TID WC  . levalbuterol  1.25 mg Nebulization TID  . losartan  25 mg Oral Daily  . methylPREDNISolone (SOLU-MEDROL) injection  60 mg Intravenous Q12H  .  multivitamin with minerals  1 tablet Oral q morning - 10a  . pantoprazole (PROTONIX) IV  40 mg Intravenous Q12H  . pneumococcal 23 valent vaccine  0.5 mL Intramuscular Tomorrow-1000  . simvastatin  20 mg Oral QPM  . sodium chloride  3 mL Intravenous Q12H  . tiotropium  18 mcg Inhalation Daily   Continuous Infusions:  PRN Meds:.sodium chloride, acetaminophen, ALPRAZolam, bisacodyl, cyclobenzaprine, HYDROcodone-acetaminophen, nitroGLYCERIN, ondansetron (ZOFRAN) IV, sodium chloride  Assessment/Plan:   80 year old female with demand ischemia, troponin elevation with acute on chronic respiratory failure, cardiomyopathy ejection fraction of 40%, COPD exacerbation with nonobstructive coronary artery disease by catheterization in February of 2014, smoker, severely deconditioned.  1. Demand ischemia  - Is likely that the troponin elevation is secondary to acute on chronic respiratory failure/acute heart failure episode leading to stress induced cardiomyopathy as was seen on previous hospitalization in February of 2014 where cardiac catheterization demonstrated no significant coronary artery disease.  - Continuing with medical management. No cardiac catheterization.  - I will change metoprolol to bisoprolol with COPD.  - Continue with statin  - Off of IV heparin currently.  2. Acute on chronic respiratory failure/acute systolic heart failure  - BUN previously had increased from 33-59. Creatinine currently 1.07.  - She does not appear to be volume overloaded.  - Diffuse wheezes heard throughout  lungs, likely secondary to COPD although a component of heart failure cannot be excluded.  - I will add back low-dose Lasix 20 mg once a day given her ejection fraction of 40%.  - -4.8 L since admission. Excellent.  -  Stress induced cardiomyopathy appearance on echocardiogram similar to prior instance in 2014.  3. Guaiac positive stool.  - Hemoglobin, several checks, stable. No signs of acute GI bleed.  Reassuring.  - Continue with aspirin.  - PPI  4. Severe deconditioning  - I have ordered physical therapy.  - She will likely need skilled nursing facility upon discharge.  5. COPD exacerbation  - Appreciate hospitalist expertise, management.  - We have changed her to angiotensin receptor blocker as well as bisoprolol.  - On Solu-Medrol IV  6. History of paroxysmal atrial fibrillation  - She's not currently on anticoagulation. She has not demonstrated any atrial fibrillation here.  - She's not an optimal anticoagulation candidate. If atrial fibrillation returns, we could consider once again.  We'll transfer her to floor.  Ansen Sayegh 07/04/2014, 10:14 AM

## 2014-07-04 NOTE — Progress Notes (Signed)
[facility]  Erica Howard QQV:[id number] DOB: 1927-06-24 DOA: 06/30/2014 PCP: Catalina Pizza, MD   Brief narrative: 80 y.o. female, with history of COPD, ongoing smoker counseled to quit, PAT, hypertension, chronic systolic and diastolic heart failure baseline EF on this echo 40-45%, nonobstructive CAD on cath 11-2012, paroxysmal atrial fibrillation, Takatsubo cardiomyopathy he diagnosed last year. She initially presented to [facility] ER for shortness of breath, workup at any [facility] ER revealed a troponin of 2.5, elevated pro BNP, bilateral wheezing. She was then transferred and admitted by cardiology service to [facility] cone stepdown. She had initially been placed on IV heparin along with aspirin, during her routine workup it was noted that her stool was dark and Hemoccult positive, she also had evidence of COPD exacerbation, she was started on IV steroids, IV PPI, heparin and aspirin were stopped.  HPI/Subjective: No complaints verbalized  Assessment/Plan:    Acute on chronic respiratory failure/COPD exacerbation/Smoker Appears compensated-taper steroids-wean oxygen and keep saturations greater than 92%-smoking cessation counseling    Heme positive stool Hemoglobin has remained stable without signs of active bleeding-recommend outpatient GI evaluation-consider resuming DVT prophylaxis dose of anticoagulation and monitor for signs of bleeding    Azotemia Likely multifactorial secondary to volume depletion and use of steroids; possible transient GI bleed contributing   Hyperglycemia Hemoglobin A1c 6.1-does NOT meet criteria for DM - will need ongoing outpt monitoring - likely due to steroid use   Abnormal TSH Likely related to sick euthyroid syndrome-check free T4 and T3    Acute combined systolic and diastolic heart failure/ History of Takotsubo syndrome  Per primary team/cardiology     Hx of PAF Per primary team/cardiology    ACS (acute  coronary syndrome) Per primary team/cardiology   DVT prophylaxis: SCDs Code Status: Full Family Communication: No family at bedside  Antibiotics: None  Objective: Blood pressure 124/56, pulse 97, temperature 98.6 F (37 C), temperature source Oral, resp. rate 26, height  (1.626 m), weight 122 lb 12.7 oz (55.7 kg), SpO2 100.00%.  Intake/Output Summary (Last 24 hours) at 07/04/14 1243 Last data filed at 07/04/14 1136  Gross per 24 hour  Intake    480 ml  Output    950 ml  Net   -470 ml   Exam: Gen: No acute respiratory distress-sleeping but easily awakened Chest: Clear to auscultation bilaterally without wheezes, rhonchi or crackles, 3 L Cardiac: Regular rate and rhythm, S1-S2, no rubs murmurs or gallops, no peripheral edema, no JVD Abdomen: Soft nontender nondistended without obvious hepatosplenomegaly, no ascites Extremities: Symmetrical in appearance without cyanosis, clubbing or effusion  Scheduled Meds:  Scheduled Meds: . antiseptic oral rinse  7 mL Mouth Rinse BID  . aspirin EC  81 mg Oral Daily  . bisoprolol  5 mg Oral Daily  . furosemide  20 mg Oral Daily  . insulin aspart  0-5 Units Subcutaneous QHS  . insulin aspart  0-9 Units Subcutaneous TID WC  . levalbuterol  1.25 mg Nebulization TID  . losartan  25 mg Oral Daily  . methylPREDNISolone (SOLU-MEDROL) injection  60 mg Intravenous Q12H  . multivitamin with minerals  1 tablet Oral q morning - 10a  . pantoprazole (PROTONIX) IV  40 mg Intravenous Q12H  . pneumococcal 23 valent vaccine  0.5 mL Intramuscular Tomorrow-1000  . simvastatin  20 mg Oral QPM  . sodium chloride  3 mL Intravenous Q12H  . tiotropium  18 mcg Inhalation Daily  Data Reviewed: Basic Metabolic Panel:  Recent Labs Lab 06/30/14 2210 07/01/14 0420 07/02/14 0458 07/03/14 0550 07/04/14 0116  NA 136*  --  136* 141 143  K 4.3  --  3.0* 4.5 4.2  CL 93*  --  91* 97 100  CO2 29  --  28 32 31  GLUCOSE 151*  --  165* 156* 147*  BUN 18  --   33* 52* 59*  CREATININE 1.03  --  0.98 1.07 1.07  CALCIUM 10.9*  --  10.7* 10.9* 10.7*  MG  --  1.5  --   --   --    Liver Function Tests:  Recent Labs Lab 06/30/14 2210 07/02/14 0458  AST 32 36  ALT 15 16  ALKPHOS 60 54  BILITOT 0.4 0.4  PROT 6.9 6.5  ALBUMIN 4.0 3.6    Recent Labs Lab 07/01/14 0810  LIPASE 24  AMYLASE 36   CBC:  Recent Labs Lab 06/30/14 2210 07/03/14 1245 07/03/14 1827 07/04/14 0116  WBC 11.2*  --   --  13.0*  NEUTROABS 8.7*  --   --   --   HGB 14.0 13.2 13.2 12.7  HCT 41.3 38.6 39.6 37.7  MCV 90.8  --   --  88.9  PLT 251  --   --  217   Cardiac Enzymes:  Recent Labs Lab 06/30/14 2210 07/01/14 0420 07/01/14 0810 07/01/14 1405 07/02/14 0458  TROPONINI 2.59* 1.25* 1.63* 1.81* 1.04*   BNP (last 3 results)  Recent Labs  06/30/14 2210 07/04/14 0116  PROBNP 4472.0* 19784.0*   CBG:  Recent Labs Lab 07/03/14 1546 07/03/14 2216 07/04/14 0817  GLUCAP 147* 146* 152*    Recent Results (from the past 240 hour(s))  MRSA PCR SCREENING     Status: None   Collection Time    07/01/14  1:48 AM      Result Value Ref Range Status   MRSA by PCR NEGATIVE  NEGATIVE Final   Comment:            The GeneXpert MRSA Assay (FDA     approved for NASAL specimens     only), is one component of a     comprehensive MRSA colonization     surveillance program. It is not     intended to diagnose MRSA     infection nor to guide or     monitor treatment for     MRSA infections.     Studies:  Recent x-ray studies have been reviewed in detail by the Attending Physician  Time spent : 25 mins      Junious Silk, ANP [facility] Office  [phone] Pager [phone]   **If unable to reach the above provider after paging please contact the Flow Manager @ [phone]  On-Call/Text Page:      [email or web address]      password [email or web address]  If 7PM-7AM, please contact night-coverage [email or web address] Password [email or web address] 07/04/2014, 12:43 PM   LOS: 4 days    I have personally examined this patient and reviewed the entire database. I have reviewed the above note, made any necessary editorial changes, and agree with its content.  Lonia Blood, MD [facility]

## 2014-07-04 NOTE — Progress Notes (Signed)
Report called to [facility], RN, all questions answered, no complaints. Daughter, Clydie Braun, notified.

## 2014-07-04 NOTE — Progress Notes (Signed)
Utilization review completed.  

## 2014-07-05 DIAGNOSIS — J9620 Acute and chronic respiratory failure, unspecified whether with hypoxia or hypercapnia: Secondary | ICD-10-CM

## 2014-07-05 DIAGNOSIS — R195 Other fecal abnormalities: Secondary | ICD-10-CM

## 2014-07-05 DIAGNOSIS — I2589 Other forms of chronic ischemic heart disease: Secondary | ICD-10-CM

## 2014-07-05 DIAGNOSIS — R0902 Hypoxemia: Secondary | ICD-10-CM

## 2014-07-05 LAB — GLUCOSE, CAPILLARY
GLUCOSE-CAPILLARY: 136 mg/dL — AB (ref 70–99)
GLUCOSE-CAPILLARY: 114 mg/dL — AB (ref 70–99)
GLUCOSE-CAPILLARY: 139 mg/dL — AB (ref 70–99)
Glucose-Capillary: 172 mg/dL — ABNORMAL HIGH (ref 70–99)

## 2014-07-05 LAB — CBC
HCT: 38.8 % (ref 36.0–46.0)
Hemoglobin: 12.6 g/dL (ref 12.0–15.0)
MCH: 29.5 pg (ref 26.0–34.0)
MCHC: 32.5 g/dL (ref 30.0–36.0)
MCV: 90.9 fL (ref 78.0–100.0)
Platelets: 218 10*3/uL (ref 150–400)
RBC: 4.27 MIL/uL (ref 3.87–5.11)
RDW: 14.3 % (ref 11.5–15.5)
WBC: 13 10*3/uL — ABNORMAL HIGH (ref 4.0–10.5)

## 2014-07-05 LAB — BASIC METABOLIC PANEL
Anion gap: 14 (ref 5–15)
BUN: 63 mg/dL — ABNORMAL HIGH (ref 6–23)
CHLORIDE: 100 meq/L (ref 96–112)
CO2: 30 mEq/L (ref 19–32)
Calcium: 10.7 mg/dL — ABNORMAL HIGH (ref 8.4–10.5)
Creatinine, Ser: 1.18 mg/dL — ABNORMAL HIGH (ref 0.50–1.10)
GFR calc Af Amer: 47 mL/min — ABNORMAL LOW (ref >90)
GFR, EST NON AFRICAN AMERICAN: 41 mL/min — AB (ref >90)
GLUCOSE: 139 mg/dL — AB (ref 70–99)
Potassium: 4.7 mEq/L (ref 3.7–5.3)
Sodium: 144 mEq/L (ref 137–147)

## 2014-07-05 LAB — T4, FREE: Free T4: 1.24 ng/dL (ref 0.80–1.80)

## 2014-07-05 MED ORDER — PANTOPRAZOLE SODIUM 40 MG PO TBEC
40.0000 mg | DELAYED_RELEASE_TABLET | Freq: Two times a day (BID) | ORAL | Status: DC
  Administered 2014-07-05 – 2014-07-07 (×5): 40 mg via ORAL
  Filled 2014-07-05 (×5): qty 1

## 2014-07-05 MED ORDER — PREDNISONE 20 MG PO TABS
40.0000 mg | ORAL_TABLET | Freq: Every day | ORAL | Status: DC
  Administered 2014-07-06 – 2014-07-07 (×2): 40 mg via ORAL
  Filled 2014-07-05 (×3): qty 2

## 2014-07-05 MED ORDER — IPRATROPIUM-ALBUTEROL 0.5-2.5 (3) MG/3ML IN SOLN
3.0000 mL | RESPIRATORY_TRACT | Status: DC
  Administered 2014-07-05 – 2014-07-07 (×12): 3 mL via RESPIRATORY_TRACT
  Filled 2014-07-05 (×12): qty 3

## 2014-07-05 MED ORDER — LEVALBUTEROL HCL 1.25 MG/0.5ML IN NEBU
1.2500 mg | INHALATION_SOLUTION | Freq: Four times a day (QID) | RESPIRATORY_TRACT | Status: DC | PRN
  Filled 2014-07-05: qty 0.5

## 2014-07-05 NOTE — Progress Notes (Addendum)
PCP: Catalina Pizza, MD  Cardiologist: Dr. [name]     Subjective:  Comfortable in bed. Wants to know when she can go home. PT recommends SNF.   Objective:  Vital Signs in the last 24 hours: Temp:  [97.7 F (36.5 C)-98.7 F (37.1 C)] 98.6 F (37 C) (09/16 0344) Pulse Rate:  [71-74] 71 (09/16 0344) Resp:  [18-20] 18 (09/16 0344) BP: (99-106)/(39-44) 100/43 mmHg (09/16 0344) SpO2:  [95 %-100 %] 95 % (09/16 0344) Weight:  [125 lb 10.6 oz (57 kg)] 125 lb 10.6 oz (57 kg) (09/16 0237)  Intake/Output from previous day: 09/15 0701 - 09/16 0700 In: 360 [P.O.:360] Out: 750 [Urine:750]   Physical Exam: General: Elderly, frail in no acute distress. Head:  Normocephalic and atraumatic. Lungs: She is wheezing bilaterally, poor air movement Heart: Normal rate with occasional ectopy.   No murmur, rubs or gallops.  Abdomen: soft, non-tender, positive bowel sounds. Extremities: No clubbing or cyanosis. No edema. Neurologic: Alert hard of hearing. Answers questions well.     Lab Results:  Recent Labs  07/04/14 0116 07/05/14 0318  WBC 13.0* 13.0*  HGB 12.7 12.6  PLT 217 218    Recent Labs  07/04/14 0116 07/05/14 0318  NA 143 144  K 4.2 4.7  CL 100 100  CO2 31 30  GLUCOSE 147* 139*  BUN 59* 63*  CREATININE 1.07 1.18*    Telemetry: Sinus rhythm, sinus tachycardia, no adverse arrhythmias Personally viewed.  Scheduled Meds: . antiseptic oral rinse  7 mL Mouth Rinse BID  . aspirin EC  81 mg Oral Daily  . bisoprolol  5 mg Oral Daily  . insulin aspart  0-5 Units Subcutaneous QHS  . insulin aspart  0-9 Units Subcutaneous TID WC  . levalbuterol  1.25 mg Nebulization TID  . losartan  25 mg Oral Daily  . methylPREDNISolone (SOLU-MEDROL) injection  30 mg Intravenous Q12H  . multivitamin with minerals  1 tablet Oral q morning - 10a  . pantoprazole  40 mg Oral BID  . pneumococcal 23 valent vaccine  0.5 mL Intramuscular Tomorrow-1000  . simvastatin  20 mg Oral QPM  .  sodium chloride  3 mL Intravenous Q12H  . tiotropium  18 mcg Inhalation Daily   Continuous Infusions:  PRN Meds:.sodium chloride, acetaminophen, ALPRAZolam, bisacodyl, cyclobenzaprine, HYDROcodone-acetaminophen, nitroGLYCERIN, ondansetron (ZOFRAN) IV, sodium chloride  Assessment/Plan:   80 year old female with demand ischemia, troponin elevation with acute on chronic respiratory failure, cardiomyopathy ejection fraction of 40%, COPD exacerbation with nonobstructive coronary artery disease by catheterization in February of 2014, smoker, severely deconditioned.  1. Demand ischemia  - Is likely that the troponin elevation is secondary to acute on chronic respiratory failure/acute heart failure episode leading to stress induced cardiomyopathy as was seen on previous hospitalization in February of 2014 where cardiac catheterization demonstrated no significant coronary artery disease.  - Continuing with medical management. No cardiac catheterization. Daughter agrees.   - I will change metoprolol to bisoprolol with COPD.  - Continue with statin  - Off of IV heparin currently.  2. Acute on chronic respiratory failure/acute systolic heart failure  - BUN previously had increased from 33-63. Creatinine currently 1.18 increased.   - She does not appear to be volume overloaded.  - Diffuse wheezes heard throughout lungs, likely secondary to COPD although a component of heart failure cannot be excluded.  - I will stop low-dose Lasix 20 mg once a day  - -4.8 L since admission. Excellent.  -  Stress induced  cardiomyopathy appearance on echocardiogram similar to prior instance in 2014.  3. Guaiac positive stool.  - Hemoglobin, several checks, stable. No signs of acute GI bleed. Reassuring.  - Continue with aspirin.  - PPI  4. Severe deconditioning  - I have ordered physical therapy.  - skilled nursing facility upon discharge.  5. COPD exacerbation  - Appreciate hospitalist expertise, management.  -  We have changed her to angiotensin receptor blocker as well as bisoprolol.  - On Solu-Medrol IV  - On DC appreciate their recommendations.   6. History of paroxysmal atrial fibrillation  - She's not currently on anticoagulation. She has not demonstrated any atrial fibrillation here.  - She's not an optimal anticoagulation candidate. If atrial fibrillation returns, we could consider once again.  - currently NSR with PAC's  I am ready to try discharge to SNF. Case mgt and social work to help with placement. Discussed with her daughter Clydie Braun on phone. She is only child and taking care of demented father as well. Would like her to be in [place] area, [place] if possible.   Also discussed DNR with her as well. Daughter in agreement. Order written.   Coraline Talwar 07/05/2014, 9:50 AM

## 2014-07-05 NOTE — Progress Notes (Signed)
Consult F/U Note  LAKEIA BRADSHAW WUJ:[id number] DOB: 1927/07/17 DOA: 06/30/2014 PCP: Catalina Pizza, MD   Brief narrative: 80 y.o. female, with history of COPD, ongoing smoker counseled to quit, PAT, hypertension, chronic systolic and diastolic heart failure baseline EF on this echo 40-45%, nonobstructive CAD on cath 11-2012, paroxysmal atrial fibrillation, Takatsubo cardiomyopathy he diagnosed last year. She initially presented to [facility] ER for shortness of breath, workup at any [facility] ER revealed a troponin of 2.5, elevated pro BNP, bilateral wheezing. She was then transferred and admitted by cardiology service to [facility] cone stepdown. She had initially been placed on IV heparin along with aspirin, during her routine workup it was noted that her stool was dark and Hemoccult positive, she also had evidence of COPD exacerbation, she was started on IV steroids, IV PPI, heparin and aspirin were stopped.  HPI/Subjective: No complaints verbalized  Assessment/Plan:    Acute on chronic respiratory failure/COPD exacerbation/Smoker Appears compensated-taper steroids-wean oxygen and keep saturations greater than 92%-smoking cessation counseling    Heme positive stool Hemoglobin has remained stable without signs of active bleeding-recommend outpatient GI evaluation-consider resuming DVT prophylaxis dose of anticoagulation and monitor for signs of bleeding    Azotemia Likely multifactorial secondary to volume depletion and use of steroids; possible transient GI bleed contributing   Hyperglycemia Hemoglobin A1c 6.1-does NOT meet criteria for DM - will need ongoing outpt monitoring - likely due to steroid use   Abnormal TSH Likely related to sick euthyroid syndrome-    Acute combined systolic and diastolic heart failure/ History of Takotsubo syndrome  Per primary team/cardiology     Hx of PAF Per primary team/cardiology    ACS (acute coronary syndrome) Per primary team/cardiology   DVT  prophylaxis: SCDs Code Status: Full Family Communication: No family at bedside  Antibiotics: None  Objective: Blood pressure 110/48, pulse 67, temperature 98.2 F (36.8 C), temperature source Oral, resp. rate 18, height  (1.626 m), weight 57 kg (125 lb 10.6 oz), SpO2 97.00%.  Intake/Output Summary (Last 24 hours) at 07/05/14 1405 Last data filed at 07/05/14 0930  Gross per 24 hour  Intake    240 ml  Output    500 ml  Net   -260 ml   Exam: Gen: No acute respiratory distress-sleeping but easily awakened Chest: Clear to auscultation bilaterally without wheezes, rhonchi or crackles, 3 L Cardiac: Regular rate and rhythm, S1-S2, no rubs murmurs or gallops, no peripheral edema, no JVD Abdomen: Soft nontender nondistended without obvious hepatosplenomegaly, no ascites Extremities: Symmetrical in appearance without cyanosis, clubbing or effusion  Scheduled Meds:  Scheduled Meds: . antiseptic oral rinse  7 mL Mouth Rinse BID  . aspirin EC  81 mg Oral Daily  . bisoprolol  5 mg Oral Daily  . insulin aspart  0-5 Units Subcutaneous QHS  . insulin aspart  0-9 Units Subcutaneous TID WC  . ipratropium-albuterol  3 mL Nebulization Q4H  . losartan  25 mg Oral Daily  . methylPREDNISolone (SOLU-MEDROL) injection  30 mg Intravenous Q12H  . multivitamin with minerals  1 tablet Oral q morning - 10a  . pantoprazole  40 mg Oral BID  . pneumococcal 23 valent vaccine  0.5 mL Intramuscular Tomorrow-1000  . simvastatin  20 mg Oral QPM  . sodium chloride  3 mL Intravenous Q12H   Data Reviewed: Basic Metabolic Panel:  Recent Labs Lab 06/30/14 2210 07/01/14 0420 07/02/14 0458 07/03/14 0550 07/04/14 0116 07/05/14 0318  NA 136*  --  136*  141 143 144  K 4.3  --  3.0* 4.5 4.2 4.7  CL 93*  --  91* 97 100 100  CO2 29  --  28 32 31 30  GLUCOSE 151*  --  165* 156* 147* 139*  BUN 18  --  33* 52* 59* 63*  CREATININE 1.03  --  0.98 1.07 1.07 1.18*  CALCIUM 10.9*  --  10.7* 10.9* 10.7* 10.7*  MG   --  1.5  --   --   --   --    Liver Function Tests:  Recent Labs Lab 06/30/14 2210 07/02/14 0458  AST 32 36  ALT 15 16  ALKPHOS 60 54  BILITOT 0.4 0.4  PROT 6.9 6.5  ALBUMIN 4.0 3.6    Recent Labs Lab 07/01/14 0810  LIPASE 24  AMYLASE 36   CBC:  Recent Labs Lab 06/30/14 2210 07/03/14 1245 07/03/14 1827 07/04/14 0116 07/05/14 0318  WBC 11.2*  --   --  13.0* 13.0*  NEUTROABS 8.7*  --   --   --   --   HGB 14.0 13.2 13.2 12.7 12.6  HCT 41.3 38.6 39.6 37.7 38.8  MCV 90.8  --   --  88.9 90.9  PLT 251  --   --  217 218   Cardiac Enzymes:  Recent Labs Lab 06/30/14 2210 07/01/14 0420 07/01/14 0810 07/01/14 1405 07/02/14 0458  TROPONINI 2.59* 1.25* 1.63* 1.81* 1.04*   BNP (last 3 results)  Recent Labs  06/30/14 2210 07/04/14 0116  PROBNP 4472.0* 19784.0*   CBG:  Recent Labs Lab 07/04/14 1117 07/04/14 1624 07/04/14 2146 07/05/14 0625 07/05/14 1111  GLUCAP 166* 163* 133* 136* 114*    Recent Results (from the past 240 hour(s))  MRSA PCR SCREENING     Status: None   Collection Time    07/01/14  1:48 AM      Result Value Ref Range Status   MRSA by PCR NEGATIVE  NEGATIVE Final   Comment:            The GeneXpert MRSA Assay (FDA     approved for NASAL specimens     only), is one component of a     comprehensive MRSA colonization     surveillance program. It is not     intended to diagnose MRSA     infection nor to guide or     monitor treatment for     MRSA infections.       If 7PM-7AM, please contact night-coverage [email or web address] Password [email or web address] 07/05/2014, 2:05 PM   LOS: 5 days     Kathlen Mody, MD [facility] [phone]

## 2014-07-06 LAB — GLUCOSE, CAPILLARY
GLUCOSE-CAPILLARY: 122 mg/dL — AB (ref 70–99)
Glucose-Capillary: 126 mg/dL — ABNORMAL HIGH (ref 70–99)
Glucose-Capillary: 139 mg/dL — ABNORMAL HIGH (ref 70–99)
Glucose-Capillary: 148 mg/dL — ABNORMAL HIGH (ref 70–99)
Glucose-Capillary: 136 mg/dL — ABNORMAL HIGH (ref 70–99)

## 2014-07-06 NOTE — Progress Notes (Signed)
Consult F/U Note  BROOKELYNNE DIMPERIO UJW:[id number] DOB: 1927/05/18 DOA: 06/30/2014 PCP: Catalina Pizza, MD   Brief narrative: 80 y.o. female, with history of COPD, ongoing smoker counseled to quit, PAT, hypertension, chronic systolic and diastolic heart failure baseline EF on this echo 40-45%, nonobstructive CAD on cath 11-2012, paroxysmal atrial fibrillation, Takatsubo cardiomyopathy he diagnosed last year. She initially presented to [facility] ER for shortness of breath, workup at any [facility] ER revealed a troponin of 2.5, elevated pro BNP, bilateral wheezing. She was then transferred and admitted by cardiology service to [facility] cone stepdown. She had initially been placed on IV heparin along with aspirin, during her routine workup it was noted that her stool was dark and Hemoccult positive, she also had evidence of COPD exacerbation, she was started on IV steroids, IV PPI, heparin and aspirin were stopped.  HPI/Subjective: No complaints verbalized  Assessment/Plan:    Acute on chronic respiratory failure/COPD exacerbation/Smoker Appears compensated-taper steroids-wean oxygen and keep saturations greater than 92%-smoking cessation counseling    Heme positive stool Hemoglobin has remained stable without signs of active bleeding-recommend outpatient GI evaluation-consider resuming DVT prophylaxis dose of anticoagulation and monitor for signs of bleeding    Azotemia Likely multifactorial secondary to volume depletion and use of steroids; possible transient GI bleed contributing   Hyperglycemia Hemoglobin A1c 6.1-does NOT meet criteria for DM - will need ongoing outpt monitoring - likely due to steroid use   Abnormal TSH Likely related to sick euthyroid syndrome- free t4 normal.     Acute combined systolic and diastolic heart failure/ History of Takotsubo syndrome  Per primary team/cardiology     Hx of PAF Per primary team/cardiology    ACS (acute coronary syndrome) Per primary  team/cardiology   DVT prophylaxis: SCDs Code Status: Full Family Communication: No family at bedside  Antibiotics: None  Objective: Blood pressure 110/43, pulse 66, temperature 98.1 F (36.7 C), temperature source Oral, resp. rate 20, height  (1.626 m), weight 57.4 kg (126 lb 8.7 oz), SpO2 94.00%.  Intake/Output Summary (Last 24 hours) at 07/06/14 1411 Last data filed at 07/06/14 0900  Gross per 24 hour  Intake    640 ml  Output    500 ml  Net    140 ml   Exam: Gen: No acute respiratory distress-sleeping but easily awakened Chest: Clear to auscultation bilaterally without wheezes, rhonchi or crackles, 3 L Cardiac: Regular rate and rhythm, S1-S2, no rubs murmurs or gallops, no peripheral edema, no JVD Abdomen: Soft nontender nondistended without obvious hepatosplenomegaly, no ascites Extremities: Symmetrical in appearance without cyanosis, clubbing or effusion  Scheduled Meds:  Scheduled Meds: . antiseptic oral rinse  7 mL Mouth Rinse BID  . aspirin EC  81 mg Oral Daily  . bisoprolol  5 mg Oral Daily  . insulin aspart  0-5 Units Subcutaneous QHS  . insulin aspart  0-9 Units Subcutaneous TID WC  . ipratropium-albuterol  3 mL Nebulization Q4H  . losartan  25 mg Oral Daily  . multivitamin with minerals  1 tablet Oral q morning - 10a  . pantoprazole  40 mg Oral BID  . pneumococcal 23 valent vaccine  0.5 mL Intramuscular Tomorrow-1000  . predniSONE  40 mg Oral QAC breakfast  . simvastatin  20 mg Oral QPM  . sodium chloride  3 mL Intravenous Q12H   Data Reviewed: Basic Metabolic Panel:  Recent Labs Lab 06/30/14 2210 07/01/14 0420 07/02/14 0458 07/03/14 0550 07/04/14 0116 07/05/14 0318  NA 136*  --  136* 141 143 144  K 4.3  --  3.0* 4.5 4.2 4.7  CL 93*  --  91* 97 100 100  CO2 29  --  28 32 31 30  GLUCOSE 151*  --  165* 156* 147* 139*  BUN 18  --  33* 52* 59* 63*  CREATININE 1.03  --  0.98 1.07 1.07 1.18*  CALCIUM 10.9*  --  10.7* 10.9* 10.7* 10.7*  MG  --   1.5  --   --   --   --    Liver Function Tests:  Recent Labs Lab 06/30/14 2210 07/02/14 0458  AST 32 36  ALT 15 16  ALKPHOS 60 54  BILITOT 0.4 0.4  PROT 6.9 6.5  ALBUMIN 4.0 3.6    Recent Labs Lab 07/01/14 0810  LIPASE 24  AMYLASE 36   CBC:  Recent Labs Lab 06/30/14 2210 07/03/14 1245 07/03/14 1827 07/04/14 0116 07/05/14 0318  WBC 11.2*  --   --  13.0* 13.0*  NEUTROABS 8.7*  --   --   --   --   HGB 14.0 13.2 13.2 12.7 12.6  HCT 41.3 38.6 39.6 37.7 38.8  MCV 90.8  --   --  88.9 90.9  PLT 251  --   --  217 218   Cardiac Enzymes:  Recent Labs Lab 06/30/14 2210 07/01/14 0420 07/01/14 0810 07/01/14 1405 07/02/14 0458  TROPONINI 2.59* 1.25* 1.63* 1.81* 1.04*   BNP (last 3 results)  Recent Labs  06/30/14 2210 07/04/14 0116  PROBNP 4472.0* 19784.0*   CBG:  Recent Labs Lab 07/05/14 1613 07/05/14 2135 07/06/14 0618 07/06/14 0809 07/06/14 1116  GLUCAP 172* 139* 126* 139* 148*    Recent Results (from the past 240 hour(s))  MRSA PCR SCREENING     Status: None   Collection Time    07/01/14  1:48 AM      Result Value Ref Range Status   MRSA by PCR NEGATIVE  NEGATIVE Final   Comment:            The GeneXpert MRSA Assay (FDA     approved for NASAL specimens     only), is one component of a     comprehensive MRSA colonization     surveillance program. It is not     intended to diagnose MRSA     infection nor to guide or     monitor treatment for     MRSA infections.       If 7PM-7AM, please contact night-coverage [email or web address] Password [email or web address] 07/06/2014, 2:11 PM   LOS: 6 days     Kathlen Mody, MD [facility] [phone]

## 2014-07-06 NOTE — Clinical Social Work Note (Addendum)
Clinical Social Work Department CLINICAL SOCIAL WORK PLACEMENT NOTE 07/06/2014  Patient:  ADASHA, BOEHME  Account Number:  [id number] Admit date:  06/30/2014  Clinical Social Worker:  Macario Golds, LCSW  Date/time:  07/06/2014 02:30 PM  Clinical Social Work is seeking post-discharge placement for this patient at the following level of care:   SKILLED NURSING   (*CSW will update this form in Epic as items are completed)   07/06/2014  Patient/family provided with [facility] Department of Clinical Social Work's list of facilities offering this level of care within the geographic area requested by the patient (or if unable, by the patient's family).  07/06/2014  Patient/family informed of their freedom to choose among providers that offer the needed level of care, that participate in Medicare, Medicaid or managed care program needed by the patient, have an available bed and are willing to accept the patient.  07/06/2014  Patient/family informed of [facility]' ownership interest in [facility], as well as of the fact that they are under no obligation to receive care at this facility.  PASARR submitted to EDS on 07/06/2014 PASARR number received on 07/06/2014  FL2 transmitted to all facilities in geographic area requested by pt/family on  07/06/2014 FL2 transmitted to all facilities within larger geographic area on   Patient informed that his/her managed care company has contracts with or will negotiate with  certain facilities, including the following:     Patient/family informed of bed offers received:  07/06/2014 Patient chooses bed at [facility] SNF Physician recommends and patient chooses bed at    Patient to be transferred to [facility] SNF on  07/07/2014 Patient to be transferred to facility by Ambulance - [facility] Patient and family notified of transfer on 07/07/2014 Name of family member notified:  Maylon Cos over the phone  [phone]   The  following physician request were entered in Epic:   Additional Comments:

## 2014-07-06 NOTE — Progress Notes (Signed)
PCP: Catalina Pizza, MD  Cardiologist: Dr. [name]     Subjective:  Comfortable in bed. Wants to know when she can go home. PT recommends SNF. Talked to daughter yesterday.   Objective:  Vital Signs in the last 24 hours: Temp:  [98.1 F (36.7 C)-98.7 F (37.1 C)] 98.1 F (36.7 C) (09/17 0557) Pulse Rate:  [65-67] 66 (09/17 0557) Resp:  [18-20] 20 (09/17 0557) BP: (110-113)/(43-48) 110/43 mmHg (09/17 0557) SpO2:  [94 %-99 %] 97 % (09/17 0557) Weight:  [126 lb 8.7 oz (57.4 kg)] 126 lb 8.7 oz (57.4 kg) (09/17 0557)  Intake/Output from previous day: 09/16 0701 - 09/17 0700 In: 640 [P.O.:640] Out: 300 [Urine:300]   Physical Exam: General: Elderly, frail in no acute distress. Head:  Normocephalic and atraumatic. Lungs: She is wheezing bilaterally, poor air movement Heart: Normal rate with occasional ectopy.   No murmur, rubs or gallops.  Abdomen: soft, non-tender, positive bowel sounds. Extremities: No clubbing or cyanosis. No edema. Neurologic: Alert hard of hearing. Answers questions well.     Lab Results:  Recent Labs  07/04/14 0116 07/05/14 0318  WBC 13.0* 13.0*  HGB 12.7 12.6  PLT 217 218    Recent Labs  07/04/14 0116 07/05/14 0318  NA 143 144  K 4.2 4.7  CL 100 100  CO2 31 30  GLUCOSE 147* 139*  BUN 59* 63*  CREATININE 1.07 1.18*    Telemetry: Sinus rhythm, sinus tachycardia, no adverse arrhythmias Personally viewed.  Scheduled Meds: . antiseptic oral rinse  7 mL Mouth Rinse BID  . aspirin EC  81 mg Oral Daily  . bisoprolol  5 mg Oral Daily  . insulin aspart  0-5 Units Subcutaneous QHS  . insulin aspart  0-9 Units Subcutaneous TID WC  . ipratropium-albuterol  3 mL Nebulization Q4H  . losartan  25 mg Oral Daily  . multivitamin with minerals  1 tablet Oral q morning - 10a  . pantoprazole  40 mg Oral BID  . pneumococcal 23 valent vaccine  0.5 mL Intramuscular Tomorrow-1000  . predniSONE  40 mg Oral QAC breakfast  . simvastatin  20 mg Oral  QPM  . sodium chloride  3 mL Intravenous Q12H   Continuous Infusions:  PRN Meds:.sodium chloride, acetaminophen, ALPRAZolam, bisacodyl, cyclobenzaprine, HYDROcodone-acetaminophen, levalbuterol, nitroGLYCERIN, ondansetron (ZOFRAN) IV, sodium chloride  Assessment/Plan:   80 year old female with demand ischemia, troponin elevation with acute on chronic respiratory failure, cardiomyopathy ejection fraction of 40%, COPD exacerbation with nonobstructive coronary artery disease by catheterization in February of 2014, smoker, severely deconditioned.  1. Demand ischemia  - Is likely that the troponin elevation is secondary to acute on chronic respiratory failure/acute heart failure episode leading to stress induced cardiomyopathy as was seen on previous hospitalization in February of 2014 where cardiac catheterization demonstrated no significant coronary artery disease.  - Continuing with medical management. No cardiac catheterization. Daughter agrees.   - I changed metoprolol to bisoprolol with COPD.  - Continue with statin  - Off of IV heparin currently.  2. Acute on chronic respiratory failure/acute systolic heart failure  - BUN previously had increased from 33-63. Creatinine currently 1.18 increased.   - She does not appear to be volume overloaded.  - Diffuse wheezes heard throughout lungs, likely secondary to COPD although a component of heart failure cannot be excluded.  - I will stop low-dose Lasix 20 mg once a day  - -4.8 L since admission. Excellent.  -  Stress induced cardiomyopathy appearance on echocardiogram similar  to prior instance in 2014.  3. Guaiac positive stool.  - Hemoglobin, several checks, stable. No signs of acute GI bleed. Reassuring.  - Continue with aspirin.  - PPI  4. Severe deconditioning  - I have ordered physical therapy.  - skilled nursing facility upon discharge.  5. COPD exacerbation  - Appreciate hospitalist expertise, management.  - We have changed her  to angiotensin receptor blocker as well as bisoprolol.  - On Solu-Medrol IV  - On DC appreciate their recommendations.   6. History of paroxysmal atrial fibrillation  - She's not currently on anticoagulation. She has not demonstrated any atrial fibrillation here.  - She's not an optimal anticoagulation candidate. If atrial fibrillation returns, we could consider once again.  - currently NSR with PAC's  I am ready for discharge to SNF. Case mgt and social work to help with placement. Discussed with her daughter Clydie Braun on phone. She is only child and taking care of demented father as well. Would like her to be in [place] area, perhaps [facility] if possible.   Also discussed DNR with her as well. Daughter in agreement. Order written.   SKAINS, MARK 07/06/2014, 8:07 AM

## 2014-07-06 NOTE — Clinical Social Work Note (Signed)
Clinical Social Work Department BRIEF PSYCHOSOCIAL ASSESSMENT 07/06/2014  Patient:  Erica Howard, Erica Howard     Account Number:  [id number]     Admit date:  06/30/2014  Clinical Social Worker:  Verl Blalock  Date/Time:  07/06/2014 02:45 PM  Referred by:  Physician  Date Referred:  07/06/2014 Referred for  SNF Placement   Other Referral:   Interview type:  Patient Other interview type:   Spoke with patient daughter over the phone per patient request    PSYCHOSOCIAL DATA Living Status:  HUSBAND Admitted from facility:   Level of care:   Primary support name:  Erica Howard  [phone] Primary support relationship to patient:  CHILD, ADULT Degree of support available:   Strong    CURRENT CONCERNS Current Concerns  Post-Acute Placement   Other Concerns:    SOCIAL WORK ASSESSMENT / PLAN Clinical Social Worker spoke with patient at bedside and patient daughter over the phone to offer support and discuss patient needs at discharge.  Patient states that she is agreeable with whatever her daughter decides is appropriate at discharge.  Patient daughter is understanding of PT recommendations for SNF placement is agreeable with [facility].  CSW initiated SNF search with preference to [facility].  CSW to follow up with patient daughter regarding available bed offers.  CSW remains available for support and to facilitate patient discharge needs once medically ready.   Assessment/plan status:  Psychosocial Support/Ongoing Assessment of Needs Other assessment/ plan:   Information/referral to community resources:   Visual merchandiser offered patient daughter facility list, however she declined stating that she was familiar with facilities in the area.  Patient daughter was receptive to further community resources if needed prior to discharge.    PATIENT'S/FAMILY'S RESPONSE TO PLAN OF CARE: Patient alert and oriented x3 laying in bed and very hard of hearing.   Patient with good family support who plan to assist patient with discharge planning needs.  Patient and patient daughter agreeable with ST-SNF placement prior to return home with patient husband.  Patient daughter understanding of CSW role and appreciation for support and involvement.

## 2014-07-06 NOTE — Progress Notes (Signed)
Physical Therapy Treatment Patient Details Name: Erica Howard MRN: [id number] DOB: [birth year] Today's Date: July 07, 2014    History of Present Illness patient is a 80 y.o. female who presents for evaluation of shortness of breath.  She has hx of Atrial fibrillation and Takatsubo cardiomyopathy. Found to have NSTEMI, COPD and CHF exacerbations.    PT Comments    Pt making slow progress.  Follow Up Recommendations  SNF     Equipment Recommendations  None recommended by PT    Recommendations for Other Services       Precautions / Restrictions Precautions Precautions: Fall    Mobility  Bed Mobility                  Transfers Overall transfer level: Needs assistance Equipment used: None Transfers: Sit to/from Stand Sit to Stand: Min assist         General transfer comment: Assist to bring hips up and for balance.  Ambulation/Gait Ambulation/Gait assistance: Min assist Ambulation Distance (Feet): 100 Feet Assistive device: Rolling walker (2 wheeled) Gait Pattern/deviations: Step-through pattern;Decreased step length - right;Decreased step length - left;Trunk flexed   Gait velocity interpretation: Below normal speed for age/gender General Gait Details: Pt required assist for balance. Pt required 2 standing rest breaks. Dyspnea 3/4.   Stairs            Wheelchair Mobility    Modified Rankin (Stroke Patients Only)       Balance   Sitting-balance support: No upper extremity supported Sitting balance-Leahy Scale: Good     Standing balance support: Single extremity supported Standing balance-Leahy Scale: Poor                      Cognition Arousal/Alertness: Awake/alert Behavior During Therapy: WFL for tasks assessed/performed Overall Cognitive Status: Difficult to assess                      Exercises      General Comments        Pertinent Vitals/Pain Pain Assessment: No/denies pain    Home Living                       Prior Function            PT Goals (current goals can now be found in the care plan section) Progress towards PT goals: Progressing toward goals    Frequency  Min 3X/week    PT Plan Current plan remains appropriate    Co-evaluation             End of Session Equipment Utilized During Treatment: Gait belt Activity Tolerance: Patient limited by fatigue Patient left: in chair;with call bell/phone within reach     Time: 1202-1215 PT Time Calculation (min): 13 min  Charges:  $Gait Training: 8-22 mins                    G Codes:      Anella Nakata 2014/07/07, 4:56 PM  [employer] PT [phone]

## 2014-07-06 NOTE — Clinical Social Work Note (Signed)
Clinical Social Worker continuing to follow patient and family for support and discharge planning needs.  CSW spoke with admissions coordinator at [facility] who states that patient has a bed available 09/18.  CSW has communicated discharge plans with patient daughter who is agreeable and requests ambulance transport at discharge.  CSW remains available for support and to facilitate patient discharge needs once medically stable.  Macario Golds, [place] [phone]

## 2014-07-07 LAB — BASIC METABOLIC PANEL
ANION GAP: 11 (ref 5–15)
BUN: 38 mg/dL — ABNORMAL HIGH (ref 6–23)
CO2: 32 meq/L (ref 19–32)
CREATININE: 0.9 mg/dL (ref 0.50–1.10)
Calcium: 9.9 mg/dL (ref 8.4–10.5)
Chloride: 98 mEq/L (ref 96–112)
GFR calc Af Amer: 66 mL/min — ABNORMAL LOW (ref >90)
GFR calc non Af Amer: 57 mL/min — ABNORMAL LOW (ref >90)
GLUCOSE: 142 mg/dL — AB (ref 70–99)
Potassium: 3.6 mEq/L — ABNORMAL LOW (ref 3.7–5.3)
Sodium: 141 mEq/L (ref 137–147)

## 2014-07-07 LAB — GLUCOSE, CAPILLARY
GLUCOSE-CAPILLARY: 167 mg/dL — AB (ref 70–99)
Glucose-Capillary: 126 mg/dL — ABNORMAL HIGH (ref 70–99)

## 2014-07-07 MED ORDER — PREDNISONE 20 MG PO TABS: 20.0000 mg | ORAL_TABLET | Freq: Every day | ORAL | Status: DC

## 2014-07-07 MED ORDER — LEVALBUTEROL HCL 1.25 MG/0.5ML IN NEBU: 1.2500 mg | INHALATION_SOLUTION | Freq: Four times a day (QID) | RESPIRATORY_TRACT | Status: AC | PRN

## 2014-07-07 MED ORDER — IPRATROPIUM-ALBUTEROL 0.5-2.5 (3) MG/3ML IN SOLN: 3.0000 mL | Freq: Four times a day (QID) | RESPIRATORY_TRACT | Status: DC

## 2014-07-07 MED ORDER — PANTOPRAZOLE SODIUM 40 MG PO TBEC: 40.0000 mg | DELAYED_RELEASE_TABLET | Freq: Two times a day (BID) | ORAL | Status: AC

## 2014-07-07 MED ORDER — LOSARTAN POTASSIUM 25 MG PO TABS: 25.0000 mg | ORAL_TABLET | Freq: Every day | ORAL | Status: AC

## 2014-07-07 MED ORDER — POTASSIUM CHLORIDE CRYS ER 20 MEQ PO TBCR
40.0000 meq | EXTENDED_RELEASE_TABLET | Freq: Once | ORAL | Status: AC
  Administered 2014-07-07: 40 meq via ORAL
  Filled 2014-07-07: qty 2

## 2014-07-07 MED ORDER — BISOPROLOL FUMARATE 5 MG PO TABS: 5.0000 mg | ORAL_TABLET | Freq: Every day | ORAL | Status: AC

## 2014-07-07 NOTE — Discharge Instructions (Signed)
Acute Respiratory Failure °Respiratory failure is when your lungs are not working well and your breathing (respiratory) system fails. When respiratory failure occurs, it is difficult for your lungs to get enough oxygen, get rid of carbon dioxide, or both. Respiratory failure can be life threatening.  °Respiratory failure can be acute or chronic. Acute respiratory failure is sudden, severe, and requires emergency medical treatment. Chronic respiratory failure is less severe, happens over time, and requires ongoing treatment.  °WHAT ARE THE CAUSES OF ACUTE RESPIRATORY FAILURE?  °Any problem affecting the heart or lungs can cause acute respiratory failure. Some of these causes include the following: °· Chronic bronchitis and emphysema (COPD).   °· Blood clot going to a lung (pulmonary embolism).   °· Having water in the lungs caused by heart failure, lung injury, or infection (pulmonary edema).   °· Collapsed lung (pneumothorax).   °· Pneumonia.   °· Pulmonary fibrosis.   °· Obesity.   °· Asthma.   °· Heart failure.   °· Any type of trauma to the chest that can make breathing difficult.   °· Nerve or muscle diseases making chest movements difficult. °WHAT SYMPTOMS SHOULD YOU WATCH FOR?  °If you have any of these signs or symptoms, you should seek immediate medical care:  °· You have shortness of breath (dyspnea) with or without activity.   °· You have rapid, fast breathing (tachypnea).   °· You are wheezing. °· You are unable to say more than a few words without having to catch your breath. °· You find it very difficult to function normally. °· You have a fast heart rate.   °· You have a bluish color to your finger or toe nail beds.   °· You have confusion or drowsiness or both.   °HOW WILL MY ACUTE RESPIRATORY FAILURE BE TREATED?  °Treatment of acute respiratory failure depends on the cause of the respiratory failure. Usually, you will stay in the intensive care unit so your breathing can be watched closely. Treatment  can include the following: °· Oxygen. Oxygen can be delivered through the following: °¨ Nasal cannula. This is small tubing that goes in your nose to give you oxygen. °¨ Face mask. A face mask covers your nose and mouth to give you oxygen. °· Medicine. Different medicines can be given to help with breathing. These can include: °¨ Nebulizers. Nebulizers deliver medicines to open the air passages (bronchodilators). These medicines help to open or relax the airways in the lungs so you can breathe better. They can also help loosen mucus from your lungs. °¨ Diuretics. Diuretic medicines can help you breathe better by getting rid of extra water in your body. °¨ Steroids. Steroid medicines can help decrease swelling (inflammation) in your lungs. °¨ Antibiotics. °· Chest tube. If you have a collapsed lung (pneumothorax), a chest tube is placed to help reinflate the lung. °· Non-invasive positive pressure ventilation (NPPV). This is a tight-fitting mask that goes over your nose and mouth. The mask has tubing that is attached to a machine. The machine blows air into the tubing, which helps to keep the tiny air sacs (alveoli) in your lungs open. This machine allows you to breathe on your own. °· Ventilator. A ventilator is a breathing machine. When on a ventilator, a breathing tube is put into the lungs. A ventilator is used when you can no longer breathe well enough on your own. You may have low oxygen levels or high carbon dioxide (CO2) levels in your blood. When you are on a ventilator, sedation and pain medicines are given to make you sleep   so your lungs can heal. °Document Released: 10/11/2013 Document Revised: 02/20/2014 Document Reviewed: 10/11/2013 °ExitCare® Patient Information ©2015 [employer]. This information is not intended to replace advice given to you by your health care provider. Make sure you discuss any questions you have with your health care provider. ° °

## 2014-07-07 NOTE — Progress Notes (Signed)
Pt discharged to [facility] via ambulance with oxygen in place. Report called.

## 2014-07-07 NOTE — Discharge Summary (Signed)
Discharge Summary   Patient ID: Erica Howard,  MRN: [id number], DOB/AGE: 1927-09-15 80 y.o.  Admit date: 06/30/2014 Discharge date: 07/07/2014  Primary Care Provider: Catalina Pizza Primary Cardiologist: Dr. Antoine Poche  Discharge Diagnoses Principal Problem:   Acute combined systolic and diastolic heart failure Active Problems:   Hypertension   Hx of PAF-Feb 2014   History of Takotsubo syndrome-Feb 2014   ACS (acute coronary syndrome)   Hypotension   Acute on chronic respiratory failure   Diastolic dysfunction- grade 2 by echo   Cardiomyopathy, ? ischemic- EF 40% with WMA   COPD exacerbation   CAD- mild CAD Feb 2014   RBBB   Smoker   Demand ischemia   Heme positive stool   Azotemia   Allergies Allergies  Allergen Reactions  . Ibuprofen Rash    Procedures  Echocardiogram LV EF: 40% - 45%  ------------------------------------------------------------------- Indications: CHF - 428.0.  ------------------------------------------------------------------- History: PMH: Takotsubo, A fib with RVR, Hypotension, Acute Coronary Syndrome, Nstemi. Chronic obstructive pulmonary disease. Risk factors: Current tobacco use.  ------------------------------------------------------------------- Study Conclusions  - Left ventricle: The cavity size was normal. Systolic function was mildly to moderately reduced. The estimated ejection fraction was in the range of 40% to 45%. There is akinesis of the mid-apicalanteroseptal, lateral, inferior, and apical myocardium. Features are consistent with a pseudonormal left ventricular filling pattern, with concomitant abnormal relaxation and increased filling pressure (grade 2 diastolic dysfunction). - Mitral valve: There was mild regurgitation. - Right ventricle: The cavity size was mildly dilated. Wall thickness was normal. - Tricuspid valve: There was moderate regurgitation. - Pulmonary arteries: Systolic pressure was mildly increased.  PA peak pressure: 38 mm Hg (S).  Impressions:  - Prior echo with no wall motion abnormalities.        Hospital Course  The patient is an 80 year old female with past medical history significant for COPD, hypertension, history of atrial fibrillation, history of Takotsubo cardiomyopathy with EF previously 35-40%, now 60% on most recent echo. Patient presented to [facility] ED on 07/01/2014 with worsening shortness of breath for the past 3-4 days. Along with increased lower extremity swelling. Chest x-ray revealed chronic congestion. BNP was elevated at 4472. Troponin was elevated at 2.59. She was given IV steroid and nebulizer for her COPD and also Lasix for her fluid overload symptom. She was then transferred to [facility] for cardiology admission. Heparin was started given the elevated troponin.  Echocardiogram was obtained on 07/02/2014 which showed EF 40-45%, akinesis of mid to apical, anteroseptal, lateral, inferior and apical myocardium, grade 2 diastolic dysfunction, mild MR, mildly dilated RV, moderate TR, PA peak pressure 38.  Her EF has decreased from her previous echo on 05/25/2013 which showed EF 60%. Patient was also having some abdominal discomfort as well. Amylase and lipase were negative. Abdominal x-ray was obtained which showed nonobstructive bowel gas pattern with no acute finding. Given concern of COPD exacerbation, she was switched to a more selective beta-1 blocker. During the hospitalization, patient did have a positive guaiac stool, her hemoglobin was closely monitored for signs of GI bleed and her hgb appeared to be stable within normal limit.   During the course of her hospitalization, her troponin had mild elevation but eventually trended down. It was felt the troponin was due to demand ischemia secondary to acute on chronic respiratory failure and acute systolic heart failure. Her echo appearance was similar to her previous Takotsubo cardiomyopathy appearance in Feb  2014 where cardiac cath demonstrated no significant CAD. Internal medicine  team was consulted to help with the COPD exacerbation and a heme-positive stool. Although she had a history of paroxysmal atrial fibrillation, she's not optimal candidate for anticoagulation therapy. Per Dr. Anne Fu, if atrial fibrillation returns, we can consider systemic anticoagulation in the future. Patient has been seen by physical therapy who recommended SNF placement. Dr. Anne Fu discussed the case with case management and patient's daughter who agreed with the decision to transfer her to SNF once she is medically stable.  Patient was seen the morning of 07/07/2014, at which she denies any significant shortness of breath, she is deemed stable for discharge to [facility] from cardiology perspective. Dr Anne Fu also discussed CODE STATUS with the patient and her daughter who has agreed to DNR STATUS  I have discussed with Dr. Blake Divine regarding her prednisone taper dose, she will take  prednisone for 3 days, then  prednisone for another 3 days before stop.   Discharge Vitals Blood pressure 121/47, pulse 70, temperature 97.9 F (36.6 C), temperature source Oral, resp. rate 20, height  (1.626 m), weight 123 lb 14.4 oz (56.201 kg), SpO2 92.00%.  Filed Weights   07/05/14 0237 07/06/14 0557 07/07/14 0618  Weight: 125 lb 10.6 oz (57 kg) 126 lb 8.7 oz (57.4 kg) 123 lb 14.4 oz (56.201 kg)    Labs  CBC  Recent Labs  07/05/14 0318  WBC 13.0*  HGB 12.6  HCT 38.8  MCV 90.9  PLT 218   Basic Metabolic Panel  Recent Labs  07/05/14 0318 07/07/14 0340  NA 144 141  K 4.7 3.6*  CL 100 98  CO2 30 32  GLUCOSE 139* 142*  BUN 63* 38*  CREATININE 1.18* 0.90  CALCIUM 10.7* 9.9    Disposition  Pt is being discharged to SNF today in good condition.  Follow-up Plans & Appointments      Follow-up Information   Follow up with [name], MD On 07/31/2014. (10:15am)    Specialty:  Cardiology    Contact information:   [address] [place] [address] [phone]       Discharge Medications    Medication List    STOP taking these medications       carvedilol 6.25 MG tablet  Commonly known as:  COREG     enalapril-hydrochlorothiazide 10-25 MG per tablet  Commonly known as:  VASERETIC     furosemide 20 MG tablet  Commonly known as:  LASIX     potassium chloride 8 MEQ tablet  Commonly known as:  KLOR-CON      TAKE these medications       ALPRAZolam 0.25 MG tablet  Commonly known as:  XANAX  Take 0.25 mg by mouth 2 (two) times daily as needed for anxiety.     aspirin EC 81 MG tablet  Take 81 mg by mouth every morning.     bisoprolol 5 MG tablet  Commonly known as:  ZEBETA  Take 1 tablet (5 mg total) by mouth daily.     COMBIVENT RESPIMAT 20-100 MCG/ACT Aers respimat  Generic drug:  Ipratropium-Albuterol  Inhale 1 puff into the lungs daily.     cyclobenzaprine 5 MG tablet  Commonly known as:  FLEXERIL  Take 5 mg by mouth at bedtime as needed and may repeat dose one time if needed for muscle spasms.     fexofenadine 180 MG tablet  Commonly known as:  ALLEGRA  Take 180 mg by mouth every morning.     HYDROcodone-acetaminophen 10-325 MG  per tablet  Commonly known as:  NORCO  Take 1 tablet by mouth every 4 (four) hours as needed for pain.     levalbuterol 1.25 MG/0.5ML nebulizer solution  Commonly known as:  XOPENEX  Take 1.25 mg by nebulization every 6 (six) hours as needed for wheezing or shortness of breath.     losartan 25 MG tablet  Commonly known as:  COZAAR  Take 1 tablet (25 mg total) by mouth daily.     nitroGLYCERIN 0.4 MG SL tablet  Commonly known as:  NITROSTAT  Place 1 tablet (0.4 mg total) under the tongue every 5 (five) minutes as needed for chest pain.     ondansetron 4 MG tablet  Commonly known as:  ZOFRAN  Take 1 tablet (4 mg total) by mouth 2 (two) times daily as needed for nausea or vomiting.     pantoprazole  40 MG tablet  Commonly known as:  PROTONIX  Take 1 tablet (40 mg total) by mouth 2 (two) times daily.     polyethylene glycol packet  Commonly known as:  MIRALAX / GLYCOLAX  Take 17 g by mouth at bedtime.     predniSONE 20 MG tablet  Commonly known as:  DELTASONE  Take 1 tablet (20 mg total) by mouth daily before breakfast.     PROBIOTIC DAILY PO  Take 1 capsule by mouth every morning.     SENIOR MULTIVITAMIN PLUS Tabs  Take 1 tablet by mouth every morning.     simvastatin 20 MG tablet  Commonly known as:  ZOCOR  Take 20 mg by mouth every evening.     triamcinolone cream 0.1 %  Commonly known as:  KENALOG  Apply 1 application topically daily as needed (for rash).     VENTOLIN HFA 108 (90 BASE) MCG/ACT inhaler  Generic drug:  albuterol  Inhale 1 puff into the lungs as needed for wheezing or shortness of breath.         Duration of Discharge Encounter   Greater than 30 minutes including physician time.  Ramond Dial PA-C Pager: [phone] 07/07/2014, 9:12 AM

## 2014-07-07 NOTE — Discharge Summary (Signed)
Personally seen and examined. Agree with above. SKAINS, MARK, MD  

## 2014-07-07 NOTE — Plan of Care (Signed)
Problem: Phase II Progression Outcomes Goal: O2 sats > equal to 90% on RA or at baseline Variance: Did not return to baseline function Comments: Pt going to SNF.

## 2014-07-07 NOTE — Progress Notes (Signed)
Consult F/U Note  Erica Howard ZOX:[id number] DOB: 11/16/1926 DOA: 06/30/2014 PCP: Catalina Pizza, MD   Brief narrative: 80 y.o. female, with history of COPD, ongoing smoker counseled to quit, PAT, hypertension, chronic systolic and diastolic heart failure baseline EF on this echo 40-45%, nonobstructive CAD on cath 11-2012, paroxysmal atrial fibrillation, Takatsubo cardiomyopathy he diagnosed last year. She initially presented to [facility] ER for shortness of breath, workup at any [facility] ER revealed a troponin of 2.5, elevated pro BNP, bilateral wheezing. She was then transferred and admitted by cardiology service to [facility] cone stepdown. She had initially been placed on IV heparin along with aspirin, during her routine workup it was noted that her stool was dark and Hemoccult positive, she also had evidence of COPD exacerbation, she was started on IV steroids, IV PPI, heparin and aspirin were stopped.  HPI/Subjective: NO NEW COMPLAINTS.   Assessment/Plan:    Acute on chronic respiratory failure/COPD exacerbation/Smoker Appears compensated-taper steroids-wean oxygen and keep saturations greater than 92%-smoking cessation counseling. Taper steroids in the next 5 to 6 days. Prednisone 20 mg daily for 3 days followed by 10 mg daily for 3 days and stop.     Heme positive stool Hemoglobin has remained stable without signs of active bleeding-recommend outpatient GI evaluation-consider resuming DVT prophylaxis dose of anticoagulation and monitor for signs of bleeding    Azotemia Likely multifactorial secondary to volume depletion and use of steroids;[name] Stable.    Hyperglycemia Hemoglobin A1c 6.1-does NOT meet criteria for DM - will need ongoing outpt monitoring - likely due to steroid use   Abnormal TSH Likely related to sick euthyroid syndrome- free t4 normal.     Acute combined systolic and diastolic heart failure/ History of Takotsubo syndrome  Per primary team/cardiology     Hx of  PAF Per primary team/cardiology    ACS (acute coronary syndrome) Per primary team/cardiology   DVT prophylaxis: SCDs Code Status: Full Family Communication: No family at bedside  Antibiotics: None  Objective: Blood pressure 116/50, pulse 70, temperature 97.9 F (36.6 C), temperature source Oral, resp. rate 20, height  (1.626 m), weight 56.201 kg (123 lb 14.4 oz), SpO2 92.00%.  Intake/Output Summary (Last 24 hours) at 07/07/14 1147 Last data filed at 07/07/14 0600  Gross per 24 hour  Intake    600 ml  Output    575 ml  Net     25 ml   Exam: Gen: No acute respiratory distress-sleeping but easily awakened Chest: Clear to auscultation bilaterally without wheezes, rhonchi or crackles, 3 L Cardiac: Regular rate and rhythm, S1-S2, no rubs murmurs or gallops, no peripheral edema, no JVD Abdomen: Soft nontender nondistended without obvious hepatosplenomegaly, no ascites Extremities: Symmetrical in appearance without cyanosis, clubbing or effusion  Scheduled Meds:  Scheduled Meds: . antiseptic oral rinse  7 mL Mouth Rinse BID  . aspirin EC  81 mg Oral Daily  . bisoprolol  5 mg Oral Daily  . insulin aspart  0-5 Units Subcutaneous QHS  . insulin aspart  0-9 Units Subcutaneous TID WC  . ipratropium-albuterol  3 mL Nebulization Q6H  . losartan  25 mg Oral Daily  . multivitamin with minerals  1 tablet Oral q morning - 10a  . pantoprazole  40 mg Oral BID  . pneumococcal 23 valent vaccine  0.5 mL Intramuscular Tomorrow-1000  . predniSONE  40 mg Oral QAC breakfast  . simvastatin  20 mg Oral QPM  . sodium chloride  3 mL Intravenous Q12H  Data Reviewed: Basic Metabolic Panel:  Recent Labs Lab 07/01/14 0420 07/02/14 0458 07/03/14 0550 07/04/14 0116 07/05/14 0318 07/07/14 0340  NA  --  136* 141 143 144 141  K  --  3.0* 4.5 4.2 4.7 3.6*  CL  --  91* 97 100 100 98  CO2  --  28 32 31 30 32  GLUCOSE  --  165* 156* 147* 139* 142*  BUN  --  33* 52* 59* 63* 38*  CREATININE   --  0.98 1.07 1.07 1.18* 0.90  CALCIUM  --  10.7* 10.9* 10.7* 10.7* 9.9  MG 1.5  --   --   --   --   --    Liver Function Tests:  Recent Labs Lab 06/30/14 2210 07/02/14 0458  AST 32 36  ALT 15 16  ALKPHOS 60 54  BILITOT 0.4 0.4  PROT 6.9 6.5  ALBUMIN 4.0 3.6    Recent Labs Lab 07/01/14 0810  LIPASE 24  AMYLASE 36   CBC:  Recent Labs Lab 06/30/14 2210 07/03/14 1245 07/03/14 1827 07/04/14 0116 07/05/14 0318  WBC 11.2*  --   --  13.0* 13.0*  NEUTROABS 8.7*  --   --   --   --   HGB 14.0 13.2 13.2 12.7 12.6  HCT 41.3 38.6 39.6 37.7 38.8  MCV 90.8  --   --  88.9 90.9  PLT 251  --   --  217 218   Cardiac Enzymes:  Recent Labs Lab 06/30/14 2210 07/01/14 0420 07/01/14 0810 07/01/14 1405 07/02/14 0458  TROPONINI 2.59* 1.25* 1.63* 1.81* 1.04*   BNP (last 3 results)  Recent Labs  06/30/14 2210 07/04/14 0116  PROBNP 4472.0* 19784.0*   CBG:  Recent Labs Lab 07/06/14 1116 07/06/14 1615 07/06/14 2205 07/07/14 0622 07/07/14 1112  GLUCAP 148* 122* 136* 126* 167*    Recent Results (from the past 240 hour(s))  MRSA PCR SCREENING     Status: None   Collection Time    07/01/14  1:48 AM      Result Value Ref Range Status   MRSA by PCR NEGATIVE  NEGATIVE Final   Comment:            The GeneXpert MRSA Assay (FDA     approved for NASAL specimens     only), is one component of a     comprehensive MRSA colonization     surveillance program. It is not     intended to diagnose MRSA     infection nor to guide or     monitor treatment for     MRSA infections.       If 7PM-7AM, please contact night-coverage [email or web address] Password [email or web address] 07/07/2014, 11:47 AM   LOS: 7 days     Kathlen Mody, MD [facility] [phone]

## 2014-07-07 NOTE — Progress Notes (Signed)
PCP: Catalina Pizza, MD  Cardiologist: Dr. [name]     Subjective:  Comfortable in bed. SNF. Talked to daughter   Objective:  Vital Signs in the last 24 hours: Temp:  [97.9 F (36.6 C)-98.7 F (37.1 C)] 97.9 F (36.6 C) (09/18 0618) Pulse Rate:  [70-73] 70 (09/17 2207) Resp:  [18-28] 20 (09/18 0618) BP: (116-128)/(47-53) 121/47 mmHg (09/18 0618) SpO2:  [92 %-98 %] 92 % (09/18 0750) Weight:  [123 lb 14.4 oz (56.201 kg)] 123 lb 14.4 oz (56.201 kg) (09/18 0618)  Intake/Output from previous day: 09/17 0701 - 09/18 0700 In: 840 [P.O.:840] Out: 775 [Urine:775]   Physical Exam: General: Elderly, frail in no acute distress. Head:  Normocephalic and atraumatic. Lungs: She is wheezing bilaterally, poor air movement Heart: Normal rate with occasional ectopy.   No murmur, rubs or gallops.  Abdomen: soft, non-tender, positive bowel sounds. Extremities: No clubbing or cyanosis. No edema. Neurologic: Alert hard of hearing. Answers questions well.     Lab Results:  Recent Labs  07/05/14 0318  WBC 13.0*  HGB 12.6  PLT 218    Recent Labs  07/05/14 0318 07/07/14 0340  NA 144 141  K 4.7 3.6*  CL 100 98  CO2 30 32  GLUCOSE 139* 142*  BUN 63* 38*  CREATININE 1.18* 0.90    Telemetry: Sinus rhythm, sinus tachycardia, no adverse arrhythmias Personally viewed.  Scheduled Meds: . antiseptic oral rinse  7 mL Mouth Rinse BID  . aspirin EC  81 mg Oral Daily  . bisoprolol  5 mg Oral Daily  . insulin aspart  0-5 Units Subcutaneous QHS  . insulin aspart  0-9 Units Subcutaneous TID WC  . ipratropium-albuterol  3 mL Nebulization Q4H  . losartan  25 mg Oral Daily  . multivitamin with minerals  1 tablet Oral q morning - 10a  . pantoprazole  40 mg Oral BID  . pneumococcal 23 valent vaccine  0.5 mL Intramuscular Tomorrow-1000  . potassium chloride  40 mEq Oral Once  . predniSONE  40 mg Oral QAC breakfast  . simvastatin  20 mg Oral QPM  . sodium chloride  3 mL Intravenous  Q12H   Continuous Infusions:  PRN Meds:.sodium chloride, acetaminophen, ALPRAZolam, bisacodyl, cyclobenzaprine, HYDROcodone-acetaminophen, levalbuterol, nitroGLYCERIN, ondansetron (ZOFRAN) IV, sodium chloride  Assessment/Plan:   80 year old female with demand ischemia, troponin elevation with acute on chronic respiratory failure, cardiomyopathy ejection fraction of 40%, COPD exacerbation with nonobstructive coronary artery disease by catheterization in February of 2014, smoker, severely deconditioned.  1. Demand ischemia  - Is likely that the troponin elevation is secondary to acute on chronic respiratory failure/acute heart failure episode leading to stress induced cardiomyopathy as was seen on previous hospitalization in February of 2014 where cardiac catheterization demonstrated no significant coronary artery disease.  - Continuing with medical management. No cardiac catheterization. Daughter agrees.   - I changed metoprolol to bisoprolol with COPD.  - Continue with statin    2. Acute on chronic respiratory failure/acute systolic heart failure  - BUN previously had increased from 33-63. Creatinine currently 1.18 increased.   - She does not appear to be volume overloaded.  - Diffuse wheezes heard throughout lungs, likely secondary to COPD although a component of heart failure cannot be excluded.  - I will stop low-dose Lasix 20 mg once a day  - -4.8 L since admission. Excellent.  -  Stress induced cardiomyopathy appearance on echocardiogram similar to prior instance in 2014. Consider repeat echo in 3 months.  3. Guaiac positive stool.  - Hemoglobin, several checks, stable. No signs of acute GI bleed. Reassuring.  - Continue with aspirin.  - PPI  4. Severe deconditioning  - I have ordered physical therapy.  - skilled nursing facility upon discharge.  5. COPD exacerbation  - Appreciate hospitalist expertise, management.  - We have changed her to angiotensin receptor blocker as  well as bisoprolol.  - On Pred taper  - On DC appreciate their recommendations.   6. History of paroxysmal atrial fibrillation  - She's not currently on anticoagulation. She has not demonstrated any atrial fibrillation here.  - She's not an optimal anticoagulation candidate. If atrial fibrillation returns, we could consider once again.  - currently NSR with PAC's  I am ready for discharge to SNF. Has a bed. Appreciate Case mgt and social work help with placement. Discussed with her daughter Clydie Braun on phone. She is only child and taking care of demented father as well. Would like her to be in [place] area, perhaps [place] if possible.   Also discussed DNR with her as well. Daughter in agreement. Order written.   DCAnne Fu, MARK 07/07/2014, 8:10 AM

## 2014-07-07 NOTE — Clinical Social Work Note (Signed)
Clinical Social Worker facilitated patient discharge including contacting patient family and facility to confirm patient discharge plans.  Clinical information faxed to facility and family agreeable with plan.  CSW arranged ambulance transport via [facility] to [place].  RN to call report prior to discharge.  Clinical Social Worker will sign off for now as social work intervention is no longer needed. Please consult [place] again if new need arises.  Macario Golds, [place] [phone]

## 2014-07-31 ENCOUNTER — Ambulatory Visit: Payer: Self-pay | Admitting: Cardiology

## 2014-09-28 ENCOUNTER — Encounter (HOSPITAL_COMMUNITY): Payer: Self-pay | Admitting: Cardiology

## 2014-12-20 ENCOUNTER — Ambulatory Visit: Payer: Self-pay | Admitting: Cardiology

## 2015-01-05 ENCOUNTER — Inpatient Hospital Stay (HOSPITAL_COMMUNITY)
Admission: EM | Admit: 2015-01-05 | Discharge: 2015-01-10 | DRG: 064 | Disposition: A | Discharge status: Hospice | Payer: Medicare Other | Attending: Internal Medicine | Admitting: Internal Medicine

## 2015-01-05 ENCOUNTER — Emergency Department (HOSPITAL_COMMUNITY): Payer: Medicare Other

## 2015-01-05 ENCOUNTER — Encounter (HOSPITAL_COMMUNITY): Payer: Self-pay | Admitting: *Deleted

## 2015-01-05 DIAGNOSIS — J810 Acute pulmonary edema: Secondary | ICD-10-CM

## 2015-01-05 DIAGNOSIS — R531 Weakness: Secondary | ICD-10-CM | POA: Insufficient documentation

## 2015-01-05 DIAGNOSIS — R51 Headache: Secondary | ICD-10-CM | POA: Diagnosis present

## 2015-01-05 DIAGNOSIS — Z66 Do not resuscitate: Secondary | ICD-10-CM | POA: Diagnosis present

## 2015-01-05 DIAGNOSIS — I252 Old myocardial infarction: Secondary | ICD-10-CM

## 2015-01-05 DIAGNOSIS — M1990 Unspecified osteoarthritis, unspecified site: Secondary | ICD-10-CM | POA: Diagnosis present

## 2015-01-05 DIAGNOSIS — I619 Nontraumatic intracerebral hemorrhage, unspecified: Secondary | ICD-10-CM | POA: Diagnosis not present

## 2015-01-05 DIAGNOSIS — I5043 Acute on chronic combined systolic (congestive) and diastolic (congestive) heart failure: Secondary | ICD-10-CM | POA: Diagnosis present

## 2015-01-05 DIAGNOSIS — R627 Adult failure to thrive: Secondary | ICD-10-CM | POA: Diagnosis not present

## 2015-01-05 DIAGNOSIS — D649 Anemia, unspecified: Secondary | ICD-10-CM | POA: Diagnosis present

## 2015-01-05 DIAGNOSIS — I611 Nontraumatic intracerebral hemorrhage in hemisphere, cortical: Principal | ICD-10-CM | POA: Diagnosis present

## 2015-01-05 DIAGNOSIS — J449 Chronic obstructive pulmonary disease, unspecified: Secondary | ICD-10-CM | POA: Diagnosis present

## 2015-01-05 DIAGNOSIS — F17210 Nicotine dependence, cigarettes, uncomplicated: Secondary | ICD-10-CM | POA: Diagnosis present

## 2015-01-05 DIAGNOSIS — K5900 Constipation, unspecified: Secondary | ICD-10-CM | POA: Diagnosis present

## 2015-01-05 DIAGNOSIS — Z515 Encounter for palliative care: Secondary | ICD-10-CM | POA: Diagnosis not present

## 2015-01-05 DIAGNOSIS — H9190 Unspecified hearing loss, unspecified ear: Secondary | ICD-10-CM | POA: Diagnosis present

## 2015-01-05 DIAGNOSIS — I10 Essential (primary) hypertension: Secondary | ICD-10-CM | POA: Diagnosis present

## 2015-01-05 DIAGNOSIS — Z79891 Long term (current) use of opiate analgesic: Secondary | ICD-10-CM | POA: Diagnosis not present

## 2015-01-05 DIAGNOSIS — I2510 Atherosclerotic heart disease of native coronary artery without angina pectoris: Secondary | ICD-10-CM | POA: Diagnosis present

## 2015-01-05 DIAGNOSIS — M810 Age-related osteoporosis without current pathological fracture: Secondary | ICD-10-CM | POA: Diagnosis present

## 2015-01-05 DIAGNOSIS — Z8249 Family history of ischemic heart disease and other diseases of the circulatory system: Secondary | ICD-10-CM

## 2015-01-05 DIAGNOSIS — I5041 Acute combined systolic (congestive) and diastolic (congestive) heart failure: Secondary | ICD-10-CM | POA: Diagnosis present

## 2015-01-05 LAB — URINALYSIS, ROUTINE W REFLEX MICROSCOPIC
Bilirubin Urine: NEGATIVE
GLUCOSE, UA: NEGATIVE mg/dL
Hgb urine dipstick: NEGATIVE
Ketones, ur: NEGATIVE mg/dL
LEUKOCYTES UA: NEGATIVE
NITRITE: NEGATIVE
PROTEIN: NEGATIVE mg/dL
Specific Gravity, Urine: 1.01 (ref 1.005–1.030)
Urobilinogen, UA: 0.2 mg/dL (ref 0.0–1.0)
pH: 7 (ref 5.0–8.0)

## 2015-01-05 LAB — CBC WITH DIFFERENTIAL/PLATELET
BASOS ABS: 0 10*3/uL (ref 0.0–0.1)
BASOS PCT: 0 % (ref 0–1)
Eosinophils Absolute: 0 10*3/uL (ref 0.0–0.7)
Eosinophils Relative: 0 % (ref 0–5)
HEMATOCRIT: 29.3 % — AB (ref 36.0–46.0)
Hemoglobin: 9.4 g/dL — ABNORMAL LOW (ref 12.0–15.0)
LYMPHS PCT: 11 % — AB (ref 12–46)
Lymphs Abs: 1.1 10*3/uL (ref 0.7–4.0)
MCH: 28.7 pg (ref 26.0–34.0)
MCHC: 32.1 g/dL (ref 30.0–36.0)
MCV: 89.3 fL (ref 78.0–100.0)
Monocytes Absolute: 0.8 10*3/uL (ref 0.1–1.0)
Monocytes Relative: 8 % (ref 3–12)
Neutro Abs: 8.1 10*3/uL — ABNORMAL HIGH (ref 1.7–7.7)
Neutrophils Relative %: 81 % — ABNORMAL HIGH (ref 43–77)
PLATELETS: 243 10*3/uL (ref 150–400)
RBC: 3.28 MIL/uL — AB (ref 3.87–5.11)
RDW: 15.6 % — ABNORMAL HIGH (ref 11.5–15.5)
WBC: 10 10*3/uL (ref 4.0–10.5)

## 2015-01-05 LAB — BASIC METABOLIC PANEL
Anion gap: 7 (ref 5–15)
BUN: 25 mg/dL — AB (ref 6–23)
CO2: 29 mmol/L (ref 19–32)
Calcium: 10.3 mg/dL (ref 8.4–10.5)
Chloride: 100 mmol/L (ref 96–112)
Creatinine, Ser: 0.98 mg/dL (ref 0.50–1.10)
GFR calc Af Amer: 59 mL/min — ABNORMAL LOW (ref >90)
GFR calc non Af Amer: 51 mL/min — ABNORMAL LOW (ref >90)
Glucose, Bld: 118 mg/dL — ABNORMAL HIGH (ref 70–99)
POTASSIUM: 4.3 mmol/L (ref 3.5–5.1)
SODIUM: 136 mmol/L (ref 135–145)

## 2015-01-05 LAB — TROPONIN I: Troponin I: <0.03 ng/mL (ref <0.031)

## 2015-01-05 MED ORDER — ACETAMINOPHEN 650 MG RE SUPP
650.0000 mg | RECTAL | Status: DC | PRN
  Administered 2015-01-05: 650 mg via RECTAL
  Filled 2015-01-05: qty 1

## 2015-01-05 MED ORDER — FUROSEMIDE 10 MG/ML IJ SOLN
40.0000 mg | Freq: Once | INTRAMUSCULAR | Status: AC
  Administered 2015-01-05: 40 mg via INTRAVENOUS
  Filled 2015-01-05: qty 4

## 2015-01-05 MED ORDER — SODIUM CHLORIDE 0.9 % IV BOLUS (SEPSIS)
1000.0000 mL | Freq: Once | INTRAVENOUS | Status: AC
  Administered 2015-01-05: 1000 mL via INTRAVENOUS

## 2015-01-05 MED ORDER — ACETAMINOPHEN 325 MG PO TABS: 650.0000 mg | ORAL_TABLET | ORAL | Status: DC | PRN

## 2015-01-05 MED ORDER — KETOROLAC TROMETHAMINE 30 MG/ML IJ SOLN
30.0000 mg | Freq: Once | INTRAMUSCULAR | Status: AC
  Administered 2015-01-05: 30 mg via INTRAVENOUS
  Filled 2015-01-05: qty 1

## 2015-01-05 NOTE — ED Notes (Signed)
Per EMS, PT has had nausea and emesis x2 days, weakness, bilateral leg swelling. Pt poor historian, family member on the way.

## 2015-01-05 NOTE — H&P (Addendum)
History and Physical  Mayer Maskerrdell S Greiner ZOX:[id number] DOB: 25-Jan-1927 DOA: 01/05/2015  Referring physician: Dr. Adriana Simasook, ED physician PCP: Catalina PizzaHALL, ZACH, MD   Chief Complaint: Failure to thrive, weakness  HPI: Erica Howard is a 81 y.o. female  With a history of profound deafness, COPD, peripheral vascular disease, hypertension, combined systolic and diastolic heart failure with an EF of 40-45% on 06/2014 with grade 2 diastolic dysfunction. The patient has had a gradual decline over the past couple of weeks with increasing complaints of pain. Although the patient has profound deafness, she is able to read lips fairly well and has been interacting with her daughter. Approximally 2 days ago the patient started having complaints of severe headache, followed by a rapid decline with difficulty ambulating with her walker, decreased verbalization, and a decreased appetite. She has had very little to eat or drink over the past 2 days.  The patient's daughter brought her to the hospital for evaluation due to the sudden decline over the past couple of days. There have been no falls according to the daughter. The patient is minimally interactive and does not answer questions.   Review of Systems:  Unable to assess due to patient's non-participation  Past Medical History  Diagnosis Date  . COPD (chronic obstructive pulmonary disease)   . Peripheral vascular disease   . Hypertension   . Arthritis   . Myocardial infarction 11/20/2012    Non obstructive CAD  . CHF (congestive heart failure) 11/20/2012    EF 35% Takatsubo.  Improved to 60%  . Tobacco abuse    Past Surgical History  Procedure Laterality Date  . No past surgeries    . Left heart catheterization with coronary angiogram N/A 11/22/2012    Procedure: LEFT HEART CATHETERIZATION WITH CORONARY ANGIOGRAM;  Surgeon: Rollene RotundaJames Hochrein, MD;  [facility];  Service: Cardiovascular;  Laterality: N/A;   Social History:  reports that she has been  smoking Cigarettes.  She has a 60 pack-year smoking history. She has never used smokeless tobacco. She reports that she does not drink alcohol or use illicit drugs. Patient lives at home with her daughter & has been able to participate in activities of daily living  Allergies  Allergen Reactions  . Ibuprofen Rash    Family History  Problem Relation Age of Onset  . Sudden death Father 5260    Possible MI  . CAD Mother 6180    MI      Prior to Admission medications   Medication Sig Start Date End Date Taking? Authorizing Provider  albuterol (VENTOLIN HFA) 108 (90 BASE) MCG/ACT inhaler Inhale 1 puff into the lungs as needed for wheezing or shortness of breath.   Yes Historical Provider, MD  allopurinol (ZYLOPRIM) 100 MG tablet Take 100 mg by mouth daily.   Yes Historical Provider, MD  ALPRAZolam (XANAX) 0.25 MG tablet Take 0.25 mg by mouth 2 (two) times daily as needed for anxiety. 11/26/12  Yes Kathlen ModyVijaya Akula, MD  aspirin EC 81 MG tablet Take 81 mg by mouth every morning.   Yes Historical Provider, MD  baclofen (LIORESAL) 10 MG tablet Take 5 mg by mouth at bedtime.   Yes Historical Provider, MD  bisoprolol (ZEBETA) 5 MG tablet Take 1 tablet (5 mg total) by mouth daily. 07/07/14  Yes Azalee CourseHao Meng, PA  calcium-vitamin D (OSCAL WITH D) 500-200 MG-UNIT per tablet Take 1 tablet by mouth 2 (two) times daily.   Yes Historical Provider, MD  cholecalciferol (VITAMIN D) 1000 UNITS  tablet Take 1,000 Units by mouth every evening.   Yes Historical Provider, MD  fexofenadine (ALLEGRA) 180 MG tablet Take 180 mg by mouth every morning.   Yes Historical Provider, MD  furosemide (LASIX) 40 MG tablet Take 40 mg by mouth daily.   Yes Historical Provider, MD  HYDROcodone-acetaminophen (NORCO) 10-325 MG per tablet Take 1 tablet by mouth every 4 (four) hours as needed for pain.    Yes Historical Provider, MD  Ipratropium-Albuterol (COMBIVENT RESPIMAT) 20-100 MCG/ACT AERS respimat Inhale 1 puff into the lungs daily.   Yes  Historical Provider, MD  losartan (COZAAR) 25 MG tablet Take 1 tablet (25 mg total) by mouth daily. 07/07/14  Yes Azalee Course, PA  Magnesium 250 MG TABS Take 1 tablet by mouth every evening.   Yes Historical Provider, MD  Multiple Vitamins-Minerals (SENIOR MULTIVITAMIN PLUS) TABS Take 1 tablet by mouth every morning.    Yes Historical Provider, MD  nitroGLYCERIN (NITROSTAT) 0.4 MG SL tablet Place 1 tablet (0.4 mg total) under the tongue every 5 (five) minutes as needed for chest pain. 03/18/13  Yes [name], MD  ondansetron (ZOFRAN-ODT) 4 MG disintegrating tablet Take 4 mg by mouth 2 (two) times daily.   Yes Historical Provider, MD  pantoprazole (PROTONIX) 40 MG tablet Take 1 tablet (40 mg total) by mouth 2 (two) times daily. 07/07/14  Yes Azalee Course, PA  polyethylene glycol (MIRALAX / GLYCOLAX) packet Take 17 g by mouth at bedtime.   Yes Historical Provider, MD  potassium chloride (KLOR-CON) 8 MEQ tablet Take 8 mEq by mouth daily.   Yes Historical Provider, MD  Probiotic Product (PROBIOTIC DAILY PO) Take 1 capsule by mouth every morning.    Yes Historical Provider, MD  simvastatin (ZOCOR) 20 MG tablet Take 20 mg by mouth every evening.   Yes Historical Provider, MD  triamcinolone cream (KENALOG) 0.1 % Apply 1 application topically daily as needed (for rash).  01/21/13  Yes Historical Provider, MD  levalbuterol (XOPENEX) 1.25 MG/0.5ML nebulizer solution Take 1.25 mg by nebulization every 6 (six) hours as needed for wheezing or shortness of breath. Patient not taking: Reported on 01/05/2015 07/07/14   Azalee Course, PA  ondansetron (ZOFRAN) 4 MG tablet Take 1 tablet (4 mg total) by mouth 2 (two) times daily as needed for nausea or vomiting. Patient not taking: Reported on 01/05/2015 01/03/14   [name], MD  predniSONE (DELTASONE) 20 MG tablet Take 1 tablet (20 mg total) by mouth daily before breakfast. Patient not taking: Reported on 01/05/2015 07/07/14   Azalee Course, PA    Physical Exam: BP 165/55 mmHg   Pulse 91  Temp(Src) 98.6 F (37 C) (Rectal)  Resp 30  Ht 5\' 5"  (1.651 m)  Wt 54.432 kg (120 lb)  BMI 19.97 kg/m2  SpO2 92%  General: Other leak Caucasian female. Awake and alert. No acute cardiopulmonary distress.  Eyes: Pupils equal, round, reactive to light. Extraocular muscles are intact. Sclerae anicteric and noninjected.  ENT:  Moist mucosal membranes. No mucosal lesions.   Neck: Neck supple without lymphadenopathy. No carotid bruits. No masses palpated.  Cardiovascular: Regular rate with normal S1-S2 sounds. No murmurs, rubs, gallops auscultated. No JVD.  Respiratory: Diminished breath sounds with slight rales. No increased work of breathing. Abdomen: Soft, appears mildly tender, nondistended. Active bowel sounds. No masses or hepatosplenomegaly  Skin: Dry, warm to touch. 2+ dorsalis pedis and radial pulses. 2+ edema from the knees distal. There is chronic venous stasis changes to the legs bilaterally. Musculoskeletal:  Leg tenderness to palpation diffusely. All major joints not erythematous nontender.  Psychiatric: Unable to assess Neurologic: The patient is minimally interactive at this time and does not follow instructions. She did squeeze my hands with intact grip strength bilaterally. There is no facial droop and tongue does protrude midline.          Labs on Admission:  Basic Metabolic Panel:  Recent Labs Lab 01/05/15 1814  NA 136  K 4.3  CL 100  CO2 29  GLUCOSE 118*  BUN 25*  CREATININE 0.98  CALCIUM 10.3   Liver Function Tests: No results for input(s): AST, ALT, ALKPHOS, BILITOT, PROT, ALBUMIN in the last 168 hours. No results for input(s): LIPASE, AMYLASE in the last 168 hours. No results for input(s): AMMONIA in the last 168 hours. CBC:  Recent Labs Lab 01/05/15 1814  WBC 10.0  NEUTROABS 8.1*  HGB 9.4*  HCT 29.3*  MCV 89.3  PLT 243   Cardiac Enzymes:  Recent Labs Lab 01/05/15 1814  TROPONINI <0.03    BNP (last 3 results) No results for  input(s): BNP in the last 8760 hours.  ProBNP (last 3 results)  Recent Labs  06/30/14 2210 07/04/14 0116  PROBNP 4472.0* 19784.0*    CBG: No results for input(s): GLUCAP in the last 168 hours.  Radiological Exams on Admission: Ct Head Wo Contrast  01/05/2015   CLINICAL DATA:  81 year old female altered mental status and nausea [place] and vomiting for 2 days.  EXAM: CT HEAD WITHOUT CONTRAST  TECHNIQUE: Contiguous axial images were obtained from the base of the skull through the vertex without intravenous contrast.  COMPARISON:  None.  FINDINGS: A 3 x 4.5 cm right temporal hematoma is noted with adjacent edema. There appears to be hemorrhage within both occipital horns.  There is no evidence of midline shift or hydrocephalus.  Moderate chronic small-vessel white matter ischemic changes are identified.  Bilateral mastoid effusions are identified.  No acute bony abnormalities are present.  IMPRESSION: 3 x 4.5 cm right temporal hematoma with probable blood within both occipital horns. No evidence of midline shift or hydrocephalus.  Chronic small-vessel white matter ischemic changes and bilateral mastoid effusions.  Critical Value/emergent results were called by telephone at the time of interpretation on 01/05/2015 at 9:17 pm to Dr. Donnetta Hutching , who verbally acknowledged these results.   Electronically Signed   By: Harmon Pier M.D.   On: 01/05/2015 21:20   Dg Chest Port 1 View  01/05/2015   CLINICAL DATA:  Bilateral leg swelling, nausea/ vomiting, weakness  EXAM: PORTABLE CHEST - 1 VIEW  COMPARISON:  07/01/2014  FINDINGS: Cardiomegaly with mild interstitial edema. No definite pleural effusions. No pneumothorax.  IMPRESSION: Cardiomegaly with mild interstitial edema.   Electronically Signed   By: Charline Bills M.D.   On: 01/05/2015 18:30    EKG: Independently reviewed. Sinus rhythm with a PVC. Prolonged QRS at 0.16 with a right bundle branch block.  Assessment/Plan Present on Admission:  . Cerebral  hemorrhage . Acute combined systolic and diastolic heart failure . Failure to thrive in adult   #1 cerebral hemorrhage #2 acute combined systolic and diastolic heart failure - mild #3 failure to thrive #4 COPD #5 hypertension #6 osteoporosis  In discussing the patient with her daughter, the daughter wants the patient to be kept comfortable. She understands that while this may be too early to determine recovery, she also understands that the patient may not recover at all. The patient's daughter declines MRI evaluation  at this time.  At the same time, I'm uncertain whether the patient would be able to lay still for the MRI. Admit the patient to MedSurg. Vitals and neuro signs every 2 hours PT, OT, speech therapy to evaluate Will maintain blood pressures out of the severe range IV Lasix to help with fluid overload, although we'll be cautious not to cause underperfusion, as this can exacerbate the patient's condition. Hospice and palliative care consult   DVT prophylaxis: Teds stocking  Consultants:   Code Status: The patient is DO NOT RESUSCITATE  Family Communication: The patient's daughter and POA is Maylon Cos - [phone]   Disposition Plan: Pending  Time spent: 70 minutes was spent with face-to-face time with patient with at least 50% with counseling and coordination of care  Levie Heritage, DO [facility] Pager [phone]

## 2015-01-05 NOTE — ED Provider Notes (Addendum)
CSN: [id number]     Arrival date & time 01/05/15  1736 History   First MD Initiated Contact with Patient 01/05/15 1737     Chief Complaint  Patient presents with  . Emesis     (Consider location/radiation/quality/duration/timing/severity/associated sxs/prior Treatment) HPI.... Level V caveat for failure to thrive, not answering questions.   Daughter reports a prolonged spiraling downward course for her mother. She has had nausea and vomiting for 2 days with associated weakness and fever. She lives at home and normally is able to walk to the bathroom with a walker. Today she has been unable to do that. Daughter reports a low-grade fever and some shakiness and feeling cold. She does not want heroics performed.  Patient has apparently gained 25 pounds of weight in the past several weeks.  Past Medical History  Diagnosis Date  . COPD (chronic obstructive pulmonary disease)   . Peripheral vascular disease   . Hypertension   . Arthritis   . Myocardial infarction 11/20/2012    Non obstructive CAD  . CHF (congestive heart failure) 11/20/2012    EF 35% Takatsubo.  Improved to 60%  . Tobacco abuse    Past Surgical History  Procedure Laterality Date  . No past surgeries    . Left heart catheterization with coronary angiogram N/A 11/22/2012    Procedure: LEFT HEART CATHETERIZATION WITH CORONARY ANGIOGRAM;  Surgeon: [name], MD;  Location: [facility] CATH LAB;  Service: Cardiovascular;  Laterality: N/A;   Family History  Problem Relation Age of Onset  . Sudden death Father 54    Possible MI  . CAD Mother 20    MI   History  Substance Use Topics  . Smoking status: Current Some Day Smoker -- 1.00 packs/day for 60 years    Types: Cigarettes    Last Attempt to Quit: 11/20/2012  . Smokeless tobacco: Never Used  . Alcohol Use: No   OB History    No data available     Review of Systems  Unable to perform ROS: Acuity of condition      Allergies  Ibuprofen  Home Medications   Prior  to Admission medications   Medication Sig Start Date End Date Taking? Authorizing Provider  albuterol (VENTOLIN HFA) 108 (90 BASE) MCG/ACT inhaler Inhale 1 puff into the lungs as needed for wheezing or shortness of breath.   Yes Historical Provider, MD  allopurinol (ZYLOPRIM) 100 MG tablet Take 100 mg by mouth daily.   Yes Historical Provider, MD  ALPRAZolam (XANAX) 0.25 MG tablet Take 0.25 mg by mouth 2 (two) times daily as needed for anxiety. 11/26/12  Yes Kathlen Mody, MD  aspirin EC 81 MG tablet Take 81 mg by mouth every morning.   Yes Historical Provider, MD  baclofen (LIORESAL) 10 MG tablet Take 5 mg by mouth at bedtime.   Yes Historical Provider, MD  bisoprolol (ZEBETA) 5 MG tablet Take 1 tablet (5 mg total) by mouth daily. 07/07/14  Yes Azalee Course, PA  calcium-vitamin D (OSCAL WITH D) 500-200 MG-UNIT per tablet Take 1 tablet by mouth 2 (two) times daily.   Yes Historical Provider, MD  cholecalciferol (VITAMIN D) 1000 UNITS tablet Take 1,000 Units by mouth every evening.   Yes Historical Provider, MD  fexofenadine (ALLEGRA) 180 MG tablet Take 180 mg by mouth every morning.   Yes Historical Provider, MD  furosemide (LASIX) 40 MG tablet Take 40 mg by mouth daily.   Yes Historical Provider, MD  HYDROcodone-acetaminophen (NORCO) 10-325 MG per tablet  Take 1 tablet by mouth every 4 (four) hours as needed for pain.    Yes Historical Provider, MD  Ipratropium-Albuterol (COMBIVENT RESPIMAT) 20-100 MCG/ACT AERS respimat Inhale 1 puff into the lungs daily.   Yes Historical Provider, MD  losartan (COZAAR) 25 MG tablet Take 1 tablet (25 mg total) by mouth daily. 07/07/14  Yes Azalee Course, PA  Magnesium 250 MG TABS Take 1 tablet by mouth every evening.   Yes Historical Provider, MD  Multiple Vitamins-Minerals (SENIOR MULTIVITAMIN PLUS) TABS Take 1 tablet by mouth every morning.    Yes Historical Provider, MD  nitroGLYCERIN (NITROSTAT) 0.4 MG SL tablet Place 1 tablet (0.4 mg total) under the tongue every 5 (five)  minutes as needed for chest pain. 03/18/13  Yes [name], MD  ondansetron (ZOFRAN-ODT) 4 MG disintegrating tablet Take 4 mg by mouth 2 (two) times daily.   Yes Historical Provider, MD  pantoprazole (PROTONIX) 40 MG tablet Take 1 tablet (40 mg total) by mouth 2 (two) times daily. 07/07/14  Yes Azalee Course, PA  polyethylene glycol (MIRALAX / GLYCOLAX) packet Take 17 g by mouth at bedtime.   Yes Historical Provider, MD  potassium chloride (KLOR-CON) 8 MEQ tablet Take 8 mEq by mouth daily.   Yes Historical Provider, MD  Probiotic Product (PROBIOTIC DAILY PO) Take 1 capsule by mouth every morning.    Yes Historical Provider, MD  simvastatin (ZOCOR) 20 MG tablet Take 20 mg by mouth every evening.   Yes Historical Provider, MD  triamcinolone cream (KENALOG) 0.1 % Apply 1 application topically daily as needed (for rash).  01/21/13  Yes Historical Provider, MD  levalbuterol (XOPENEX) 1.25 MG/0.5ML nebulizer solution Take 1.25 mg by nebulization every 6 (six) hours as needed for wheezing or shortness of breath. Patient not taking: Reported on 01/05/2015 07/07/14   Azalee Course, PA  ondansetron (ZOFRAN) 4 MG tablet Take 1 tablet (4 mg total) by mouth 2 (two) times daily as needed for nausea or vomiting. Patient not taking: Reported on 01/05/2015 01/03/14   [name], MD  predniSONE (DELTASONE) 20 MG tablet Take 1 tablet (20 mg total) by mouth daily before breakfast. Patient not taking: Reported on 01/05/2015 07/07/14   Azalee Course, PA   BP 152/55 mmHg  Pulse 78  Temp(Src) 98.6 F (37 C) (Rectal)  Resp 20  Ht  (1.651 m)  Wt 120 lb (54.432 kg)  BMI 19.97 kg/m2  SpO2 96% Physical Exam  Constitutional:  Weak, listless, does not answer questions  HENT:  Head: Normocephalic and atraumatic.  Eyes: Conjunctivae and EOM are normal. Pupils are equal, round, and reactive to light.  Neck: Normal range of motion. Neck supple.  Cardiovascular: Normal rate and regular rhythm.   Pulmonary/Chest: Effort normal and  breath sounds normal.  Abdominal: Soft. Bowel sounds are normal.  Musculoskeletal:  Unable  Neurological:  Unable  Skin:  2+ peripheral edema  Psychiatric:  Mildly obtunded  Nursing note and vitals reviewed.   ED Course  Procedures (including critical care time) Labs Review Labs Reviewed  BASIC METABOLIC PANEL - Abnormal; Notable for the following:    Glucose, Bld 118 (*)    BUN 25 (*)    GFR calc non Af Amer 51 (*)    GFR calc Af Amer 59 (*)    All other components within normal limits  CBC WITH DIFFERENTIAL/PLATELET - Abnormal; Notable for the following:    RBC 3.28 (*)    Hemoglobin 9.4 (*)    HCT 29.3 (*)  RDW 15.6 (*)    Neutrophils Relative % 81 (*)    Neutro Abs 8.1 (*)    Lymphocytes Relative 11 (*)    All other components within normal limits  URINALYSIS, ROUTINE W REFLEX MICROSCOPIC  TROPONIN I    Imaging Review Ct Head Wo Contrast  01/05/2015   CLINICAL DATA:  [age] year old female altered mental status and nausea us and vomiting for 2 days.  EXAM: CT HEAD WITHOUT CONTRAST  TECHNIQUE: Contiguous axial images were obtained from the base of the skull through the vertex without intravenous contrast.  COMPARISON:  None.  FINDINGS: A 3 x 4.5 cm right temporal hematoma is noted with adjacent edema. There appears to be hemorrhage within both occipital horns.  There is no evidence of midline shift or hydrocephalus.  Moderate chronic small-vessel white matter ischemic changes are identified.  Bilateral mastoid effusions are identified.  No acute bony abnormalities are present.  IMPRESSION: 3 x 4.5 cm right temporal hematoma with probable blood within both occipital horns. No evidence of midline shift or hydrocephalus.  Chronic small-vessel white matter ischemic changes and bilateral mastoid effusions.  Critical Value/emergent results were called by telephone at the time of interpretation on 01/05/2015 at 9:17 pm to Dr. Donnetta HutchingBRIAN Miia Blanks , who verbally acknowledged these results.    Electronically Signed   By: Harmon PierJeffrey  Hu M.D.   On: 01/05/2015 21:20   Dg Chest Port 1 View  01/05/2015   CLINICAL DATA:  Bilateral leg swelling, nausea/ vomiting, weakness  EXAM: PORTABLE CHEST - 1 VIEW  COMPARISON:  07/01/2014  FINDINGS: Cardiomegaly with mild interstitial edema. No definite pleural effusions. No pneumothorax.  IMPRESSION: Cardiomegaly with mild interstitial edema.   Electronically Signed   By: Charline BillsSriyesh  Krishnan M.D.   On: 01/05/2015 18:30     EKG Interpretation   Date/Time:  Friday January 05 2015 17:43:27 EDT Ventricular Rate:  78 PR Interval:  214 QRS Duration: 163 QT Interval:  444 QTC Calculation: 506 R Axis:   -70 Text Interpretation:  Sinus rhythm Ventricular premature complex  Borderline prolonged PR interval RBBB and LAFB Confirmed by Adriana SimasOOK  MD,  Janilah Hojnacki ([id number]) on 01/05/2015 6:09:00 PM     CRITICAL CARE Performed by: Donnetta HutchingOOK,Cleon Signorelli  ?  Total critical care time: 45  Critical care time was exclusive of separately billable procedures and treating other patients.  Critical care was necessary to treat or prevent imminent or life-threatening deterioration.  Critical care was time spent personally by me on the following activities: development of treatment plan with patient and/or surrogate as well as nursing, discussions with consultants, evaluation of patient's response to treatment, examination of patient, obtaining history from patient or surrogate, ordering and performing treatments and interventions, ordering and review of laboratory studies, ordering and review of radiographic studies, pulse oximetry and re-evaluation of patient's condition. MDM   Final diagnoses:  Weakness  Acute pulmonary edema  Failure to thrive in adult  Cerebral hemorrhage    Daughter reports patient has been doing poorly for a long time. Asked x-ray shows mild interstitial edema. IV Lasix. CT head shows a 3 x 4.5 cm right temporal bleed.   [place].  Family wants comfort care only. Admit to general medicine    Donnetta HutchingBrian Hitoshi Werts, MD 01/05/15 Forestine [id number]  Donnetta HutchingBrian Emillia Weatherly, MD 01/05/15 2149

## 2015-01-06 ENCOUNTER — Inpatient Hospital Stay (HOSPITAL_COMMUNITY): Payer: Medicare Other

## 2015-01-06 ENCOUNTER — Encounter (HOSPITAL_COMMUNITY): Payer: Self-pay | Admitting: *Deleted

## 2015-01-06 DIAGNOSIS — I5041 Acute combined systolic (congestive) and diastolic (congestive) heart failure: Secondary | ICD-10-CM

## 2015-01-06 DIAGNOSIS — I619 Nontraumatic intracerebral hemorrhage, unspecified: Secondary | ICD-10-CM

## 2015-01-06 MED ORDER — CETYLPYRIDINIUM CHLORIDE 0.05 % MT LIQD
7.0000 mL | Freq: Two times a day (BID) | OROMUCOSAL | Status: DC
  Administered 2015-01-06 – 2015-01-09 (×5): 7 mL via OROMUCOSAL

## 2015-01-06 MED ORDER — IPRATROPIUM-ALBUTEROL 0.5-2.5 (3) MG/3ML IN SOLN
3.0000 mL | Freq: Four times a day (QID) | RESPIRATORY_TRACT | Status: DC
Start: 2015-01-06 — End: 2015-01-09
  Administered 2015-01-06 – 2015-01-09 (×10): 3 mL via RESPIRATORY_TRACT
  Filled 2015-01-06 (×10): qty 3

## 2015-01-06 MED ORDER — SIMVASTATIN 20 MG PO TABS
20.0000 mg | ORAL_TABLET | Freq: Every day | ORAL | Status: DC
  Administered 2015-01-06 – 2015-01-09 (×4): 20 mg via ORAL
  Filled 2015-01-06 (×4): qty 1

## 2015-01-06 MED ORDER — STROKE: EARLY STAGES OF RECOVERY BOOK
Freq: Once | Status: AC
  Administered 2015-01-08: 10:00:00
  Filled 2015-01-06 (×2): qty 1

## 2015-01-06 MED ORDER — MORPHINE SULFATE 2 MG/ML IJ SOLN
2.0000 mg | INTRAMUSCULAR | Status: DC | PRN
  Administered 2015-01-06: 2 mg via INTRAVENOUS
  Filled 2015-01-06: qty 1

## 2015-01-06 MED ORDER — SENNOSIDES-DOCUSATE SODIUM 8.6-50 MG PO TABS
1.0000 | ORAL_TABLET | Freq: Two times a day (BID) | ORAL | Status: DC
  Administered 2015-01-06 – 2015-01-10 (×7): 1 via ORAL
  Filled 2015-01-06 (×8): qty 1

## 2015-01-06 MED ORDER — ACETAMINOPHEN 325 MG PO TABS
650.0000 mg | ORAL_TABLET | Freq: Four times a day (QID) | ORAL | Status: DC | PRN
  Administered 2015-01-08: 650 mg via ORAL
  Filled 2015-01-06: qty 2

## 2015-01-06 MED ORDER — SODIUM CHLORIDE 0.9 % IV SOLN: INTRAVENOUS | Status: DC

## 2015-01-06 MED ORDER — NITROGLYCERIN 0.4 MG SL SUBL: 0.4000 mg | SUBLINGUAL_TABLET | SUBLINGUAL | Status: DC | PRN

## 2015-01-06 MED ORDER — PANTOPRAZOLE SODIUM 40 MG IV SOLR
40.0000 mg | Freq: Every day | INTRAVENOUS | Status: DC
  Administered 2015-01-06 – 2015-01-07 (×2): 40 mg via INTRAVENOUS
  Filled 2015-01-06 (×2): qty 40

## 2015-01-06 MED ORDER — BACLOFEN 10 MG PO TABS
5.0000 mg | ORAL_TABLET | Freq: Every day | ORAL | Status: DC
  Administered 2015-01-06 – 2015-01-09 (×4): 5 mg via ORAL
  Filled 2015-01-06 (×4): qty 1

## 2015-01-06 MED ORDER — CETYLPYRIDINIUM CHLORIDE 0.05 % MT LIQD
7.0000 mL | Freq: Two times a day (BID) | OROMUCOSAL | Status: DC
  Administered 2015-01-06 – 2015-01-10 (×6): 7 mL via OROMUCOSAL

## 2015-01-06 MED ORDER — BOOST / RESOURCE BREEZE PO LIQD
1.0000 | Freq: Three times a day (TID) | ORAL | Status: DC
  Administered 2015-01-06 – 2015-01-08 (×6): 1 via ORAL

## 2015-01-06 MED ORDER — IPRATROPIUM-ALBUTEROL 0.5-2.5 (3) MG/3ML IN SOLN
3.0000 mL | RESPIRATORY_TRACT | Status: DC
  Administered 2015-01-06: 3 mL via RESPIRATORY_TRACT
  Filled 2015-01-06: qty 3

## 2015-01-06 MED ORDER — LORAZEPAM 2 MG/ML IJ SOLN
0.2500 mg | Freq: Four times a day (QID) | INTRAMUSCULAR | Status: DC | PRN
  Administered 2015-01-06: 0.25 mg via INTRAVENOUS
  Filled 2015-01-06: qty 1

## 2015-01-06 MED ORDER — HYDROCODONE-ACETAMINOPHEN 10-325 MG PO TABS
1.0000 | ORAL_TABLET | ORAL | Status: DC | PRN
  Administered 2015-01-06 – 2015-01-08 (×9): 1 via ORAL
  Filled 2015-01-06 (×9): qty 1

## 2015-01-06 MED ORDER — LABETALOL HCL 5 MG/ML IV SOLN
10.0000 mg | INTRAVENOUS | Status: DC | PRN
  Administered 2015-01-08: 10 mg via INTRAVENOUS
  Filled 2015-01-06: qty 4

## 2015-01-06 MED ORDER — TRIAMCINOLONE ACETONIDE 0.1 % EX CREA
1.0000 "application " | TOPICAL_CREAM | Freq: Every day | CUTANEOUS | Status: DC | PRN
  Filled 2015-01-06: qty 15

## 2015-01-06 MED ORDER — ACETAMINOPHEN 650 MG RE SUPP: 650.0000 mg | Freq: Four times a day (QID) | RECTAL | Status: DC | PRN

## 2015-01-06 MED ORDER — FUROSEMIDE 10 MG/ML IJ SOLN
20.0000 mg | Freq: Two times a day (BID) | INTRAMUSCULAR | Status: DC
  Administered 2015-01-06 (×2): 20 mg via INTRAVENOUS
  Filled 2015-01-06 (×2): qty 2

## 2015-01-06 MED ORDER — ALBUTEROL SULFATE (2.5 MG/3ML) 0.083% IN NEBU: 2.5000 mg | INHALATION_SOLUTION | RESPIRATORY_TRACT | Status: DC | PRN

## 2015-01-06 MED ORDER — POTASSIUM CHLORIDE CRYS ER 10 MEQ PO TBCR
10.0000 meq | EXTENDED_RELEASE_TABLET | Freq: Two times a day (BID) | ORAL | Status: DC
  Administered 2015-01-06 – 2015-01-10 (×8): 10 meq via ORAL
  Filled 2015-01-06 (×13): qty 1

## 2015-01-06 NOTE — Progress Notes (Signed)
INITIAL NUTRITION ASSESSMENT  DOCUMENTATION CODES Per approved criteria  -Not Applicable   INTERVENTION: -Recommend Swallow evaluation  -Resource Breeze po TID, each supplement provides 250 kcal and 9 grams of protein   NUTRITION DIAGNOSIS: Inadequate oral intake related to AMS as evidenced by little to no intake past 2 days.   Goal: Pt to meet >/= 90% of their estimated nutrition needs   Monitor:  Goals of care, diet order, SST consult, oral intake  Reason for Assessment: MST 2  81 y.o. female  Admitting Dx: Cerebral hemorrhage  ASSESSMENT: 81 y.o. female pmhx: f profound deafness, COPD, peripheral vascular disease, hypertension, combined systolic and diastolic heart failure. The patient has had a gradual decline over the past couple of weeks with increasing complaints of pain. 2 days ago sharp decline in ADLs  Per notes: Pt has had very little to eat over past 2 days  Height: Ht Readings from Last 1 Encounters:  01/06/15 5\' 2"  (1.575 m)    Weight: Wt Readings from Last 1 Encounters:  01/06/15 162 lb (73.483 kg)  Admit at 120 lbs: 54.5 kg  Ideal Body Weight: 110  % Ideal Body Weight: 109%  Wt Readings from Last 10 Encounters:  01/06/15 162 lb (73.483 kg)  07/07/14 123 lb 14.4 oz (56.201 kg)  01/03/14 132 lb (59.875 kg)  06/02/13 134 lb (60.782 kg)  03/18/13 133 lb (60.328 kg)  01/17/13 132 lb (59.875 kg)  12/17/12 136 lb (61.689 kg)  11/26/12 130 lb 3.2 oz (59.058 kg)   Usual Body Weight: 130 lbs  BMI:  Body mass index is 29.62 kg/(m^2).  Estimated Nutritional Needs: Kcal: 1350-1650 (25-30 kcal/kg dosing weight)  Protein: >76 g (1.4 g/kg) Fluid: 1.4-1.7   Skin: Dry + per notes sacral wound on admit  Diet Order: Diet clear liquid  EDUCATION NEEDS: -No education needs identified at this time   Intake/Output Summary (Last 24 hours) at 01/06/15 1449 Last data filed at 01/06/15 1126  Gross per 24 hour  Intake      0 ml  Output   3400 ml  Net   -3400 ml    Last BM:  3/18  Labs:   Recent Labs Lab 01/05/15 1814  NA 136  K 4.3  CL 100  CO2 29  BUN 25*  CREATININE 0.98  CALCIUM 10.3  GLUCOSE 118*    CBG (last 3)  No results for input(s): GLUCAP in the last 72 hours.  Scheduled Meds: .  stroke: mapping our early stages of recovery book   Does not apply Once  . antiseptic oral rinse  7 mL Mouth Rinse BID  . antiseptic oral rinse  7 mL Mouth Rinse BID  . furosemide  20 mg Intravenous BID  . ipratropium-albuterol  3 mL Nebulization Q4H  . pantoprazole (PROTONIX) IV  40 mg Intravenous QHS  . potassium chloride  10 mEq Oral BID  . senna-docusate  1 tablet Oral BID  . simvastatin  20 mg Oral q1800    Continuous Infusions:   Past Medical History  Diagnosis Date  . COPD (chronic obstructive pulmonary disease)   . Peripheral vascular disease   . Hypertension   . Arthritis   . Myocardial infarction 11/20/2012    Non obstructive CAD  . CHF (congestive heart failure) 11/20/2012    EF 35% Takatsubo.  Improved to 60%  . Tobacco abuse     Past Surgical History  Procedure Laterality Date  . No past surgeries    . Left  heart catheterization with coronary angiogram N/A 11/22/2012    Procedure: LEFT HEART CATHETERIZATION WITH CORONARY ANGIOGRAM;  Surgeon: [name], MD;  Location: [facility] CATH LAB;  Service: Cardiovascular;  Laterality: N/A;    Christophe Louis RD, LDN Nutrition Pager: [phone] 01/06/2015 2:49 PM

## 2015-01-06 NOTE — Plan of Care (Signed)
Problem: Acute Treatment Outcomes Goal: Other Acute Treatment Outcomes Outcome: Completed/Met Date Met:  01/06/15 Stroke booklet given to family.

## 2015-01-06 NOTE — Progress Notes (Addendum)
PT Cancellation Note  Patient Details Name: Erica Howard MRN: [id number] DOB: 1927/07/21   Cancelled Treatment:    Reason Eval/Treat Not Completed: Fatigue/lethargy limiting ability to participate.  RN states that pt will arouse momentarily but gets very upset if she is touched by anyone.  She is not yet ready to tolerate a PT eval.  Will check with her RN again tomorrow to determine suitability for evaluation.   Konrad PentaBrown, Jeanene Mena L 01/06/2015, 1:23 PM

## 2015-01-06 NOTE — Progress Notes (Addendum)
PROGRESS NOTE  Erica Howard [id number] DOB: 31-Jul-1927 DOA: 01/05/2015 PCP: Catalina PizzaHALL, ZACH, MD  Summary: 81 year old woman presented to the emergency department after 2 day history of headache, followed by rapid decline with difficulty ambulating, decreased verbalization and decreased appetite. CT of the head revealed temporal hemorrhage, EDP discussed with neurosurgery, based on goals care patient was admitted to AP with goal of conservative care.  Assessment/Plan: 1. Right temporal cerebral bleed. Patient now awake and alert. Follows commands. Daughter declined MRI per H&P. EDP d/w neurosurgery but based on goals of care patient was admitted to AP. Note no palliative care consultation is available until 3/22. Not clear whether spontaneous or traumatic. Was on ASA. 2. HTN. Goal SBP 160/90. 3. Acute on chronic combined systolic and diastolic CHF, LVEF 40-45% 06/2014. 4. COPD with active wheezing. 5. Deaf  6. Sacral wound present on admission   Patient now awake, alert and follows commands. Prognosis remains guarded. Based on goals of care as articulated in the chart, plan:  Control BP, restart oral meds (patient passed swallow screen), clear liquid diet  Continue to hold ASA  CT head in AM  Strict I/O  Continue diuresis  I have been unable to reach the power of attorney by telephone but have the left message.  Addendum Discussed with daughter/POA at bedside. She is very pleased with care. Understands prognosis is guarded and patient may worsen or die. Nevertheless she wants to focus on comfort and palliative care. No aggressive measures.  Code Status: DNR DVT prophylaxis: SCDs Family Communication: daughter/POA Maylon CosKaren Powell [phone]  Disposition Plan: pending  Brendia Sacksaniel Goodrich, MD  [facility]  Pager [phone] If 7PM-7AM, please contact night-coverage at [email or web address], password [email or web address] 01/06/2015, 12:56 PM  LOS: 1 day    Consultants:    Procedures:    Antibiotics:    HPI/Subjective: Per was somnolent this AM but woke up around noon.  Very hard of hearing, complains of HA.  Objective: Filed Vitals:   01/05/15 2315 01/06/15 0038 01/06/15 0226 01/06/15 0620  BP:   174/57 190/68  Pulse: 90  81 87  Temp:   99.1 F (37.3 C) 98.3 F (36.8 C)  TempSrc:   Oral Oral  Resp: 21  16 22   Height:  5\' 2"  (1.575 m)    Weight:  73.483 kg (162 lb)    SpO2: 99%  98% 95%    Intake/Output Summary (Last 24 hours) at 01/06/15 1256 Last data filed at 01/06/15 1126  Gross per 24 hour  Intake      0 ml  Output   3400 ml  Net  -3400 ml     Filed Weights   01/05/15 1741 01/06/15 0038  Weight: 54.432 kg (120 lb) 73.483 kg (162 lb)    Exam:  General: Appears calm, moderately uncomfortable but not toxic Eyes: pupils, irises, lids appear unremarkable ENT: very hard of hearing Cardiovascular: RRR, no m/r/g. 2+ bilateral LE edema. Respiratory: bilateral rales and wheezes, no rhonch. Tachypneic, mild to moderate increased respiratory effort Musculoskeletal: grossly normal tone BUE/BLE; moves all extremities Psychiatric: grossly normal mood and affect, speech fluent and appropriate Neurologic: cranial nerves appear intact  Data Reviewed:  UOP 1700  Basic metabolic panel unremarkable  Troponin negative  Hemoglobin 9.4 on admission  Urinalysis negative  EKG sinus rhythm, PVC, right bundle branch block (old, seen 07/01/2014)  Scheduled Meds: .  stroke: mapping our early stages of recovery book   Does not apply Once  . sodium chloride   Intravenous  STAT  . antiseptic oral rinse  7 mL Mouth Rinse BID  . antiseptic oral rinse  7 mL Mouth Rinse BID  . furosemide  20 mg Intravenous BID  . pantoprazole (PROTONIX) IV  40 mg Intravenous QHS  . potassium chloride  10 mEq Oral BID  . senna-docusate  1 tablet Oral BID   Continuous Infusions:   Principal Problem:   Cerebral hemorrhage Active  Problems:   Acute combined systolic and diastolic heart failure   Failure to thrive in adult   Time spent 35 minutes

## 2015-01-07 LAB — BASIC METABOLIC PANEL
Anion gap: 9 (ref 5–15)
BUN: 27 mg/dL — AB (ref 6–23)
CHLORIDE: 99 mmol/L (ref 96–112)
CO2: 31 mmol/L (ref 19–32)
Calcium: 9.8 mg/dL (ref 8.4–10.5)
Creatinine, Ser: 0.96 mg/dL (ref 0.50–1.10)
GFR, EST AFRICAN AMERICAN: 61 mL/min — AB (ref >90)
GFR, EST NON AFRICAN AMERICAN: 52 mL/min — AB (ref >90)
Glucose, Bld: 119 mg/dL — ABNORMAL HIGH (ref 70–99)
POTASSIUM: 3.4 mmol/L — AB (ref 3.5–5.1)
Sodium: 139 mmol/L (ref 135–145)

## 2015-01-07 MED ORDER — ONDANSETRON 4 MG PO TBDP
4.0000 mg | ORAL_TABLET | Freq: Three times a day (TID) | ORAL | Status: DC | PRN
  Administered 2015-01-07: 4 mg via ORAL
  Filled 2015-01-07: qty 1

## 2015-01-07 MED ORDER — ONDANSETRON HCL 4 MG/2ML IJ SOLN: 4.0000 mg | Freq: Once | INTRAMUSCULAR | Status: DC

## 2015-01-07 MED ORDER — FUROSEMIDE 40 MG PO TABS
40.0000 mg | ORAL_TABLET | Freq: Every day | ORAL | Status: DC
  Administered 2015-01-07 – 2015-01-10 (×4): 40 mg via ORAL
  Filled 2015-01-07 (×4): qty 1

## 2015-01-07 NOTE — Progress Notes (Signed)
PT Cancellation Note  Patient Details Name: Erica Howard MRN: [id number] DOB: 08/18/1927   Cancelled Treatment:    Reason Eval/Treat Not Completed: Fatigue/lethargy limiting ability to participate.  An attempt was made to see pt for an evaluation.  She was awake and able to verbalize normally.  She c/o headache, stomachache and bilateral foot pain.  I attempted to begin a basic muscle test but she was unable to tolerate my touching her.  She then stated that she thought that she was going to throw up.  At that point, I alerted RN to these problems.  It is clear that pt is unable at this point to tolerated a PT eval.  I will attempt again tomorrow.   Myrlene BrokerBrown, Nathan Moctezuma L 01/07/2015, 11:04 AM

## 2015-01-07 NOTE — Progress Notes (Signed)
PROGRESS NOTE  Erica Howard ZOX:[id number] DOB: 02-04-1927 DOA: 01/05/2015 PCP: Catalina PizzaHALL, ZACH, MD  Summary: 81 year old woman presented to the emergency department after 2 day history of headache, followed by rapid decline with difficulty ambulating, decreased verbalization and decreased appetite. CT of the head revealed temporal hemorrhage, EDP discussed with neurosurgery, based on goals care patient was admitted to AP with goal of conservative care.  Assessment/Plan: 1. Right temporal cerebral bleed. Clinically stable. Remains awake, alert and follows commands. EDP d/w neurosurgery but based on goals of care patient was admitted to AP. Note no palliative care consultation is available until 3/22. Not clear whether spontaneous or traumatic. Was on ASA prior to admission. 2. HTN. Goal SBP 160/90. Stable. 3. Acute on chronic combined systolic and diastolic CHF, LVEF 40-45% 06/2014. Acute component appears to be rapidly improving. Plan change to oral Lasix in the morning. 4. COPD. Appears stable. 5. Deaf  6. Sacral wound present on admission   Overall remarkable improvement. We will prognosis remains guarded she remains awake and alert. Discussed with daughter by telephone. Plan to hold off on any further imaging and just follow clinically as imaging would not change goals of care or plan of care.  Advance diet.  Attempt therapy evaluations again tomorrow. Anticipate discharge next 48 hours.  Wound care  Code Status: DNR DVT prophylaxis: SCDs Family Communication: daughter/POA Maylon CosKaren Powell [phone]  Disposition Plan: pending  Brendia Sacksaniel Katiejo Gilroy, MD  [facility]  Pager [phone] If 7PM-7AM, please contact night-coverage at [email or web address], password [email or web address] 01/07/2015, 4:35 PM  LOS: 2 days   Consultants:    Procedures:    Antibiotics:    HPI/Subjective: Patient could not tolerate physical therapy. Her daughter does report she has chronic pain secondary to peripheral  vascular disease and does not like to be touched.  Hungry. Would like some real food. She does have a headache. No new issues.  Objective: Filed Vitals:   01/06/15 2119 01/07/15 0550 01/07/15 0718 01/07/15 1126  BP: 158/48 169/73    Pulse: 71 71    Temp: 99.8 F (37.7 C) 99.1 F (37.3 C)    TempSrc: Oral Oral    Resp: 20 18    Height:      Weight:      SpO2: 97% 96% 94% 98%    Intake/Output Summary (Last 24 hours) at 01/07/15 1635 Last data filed at 01/07/15 0356  Gross per 24 hour  Intake    120 ml  Output   1875 ml  Net  -1755 ml     Filed Weights   01/05/15 1741 01/06/15 0038  Weight: 54.432 kg (120 lb) 73.483 kg (162 lb)    Exam:  Afebrile, vital signs stable. General: Appears comfortable, calm. Eyes: Eyes appear grossly normal ENT: Extremely hard of hearing Cardiovascular: Regular rate and rhythm, no murmur, rub or gallop. Decreased lower extremity edema, 1+. Telemetry: Sinus rhythm, no arrhythmias  Respiratory: Clear to auscultation bilaterally, no wheezes, rales or rhonchi. Normal respiratory effort. Musculoskeletal: grossly normal tone bilateral upper and lower extremities. Moves all extremities. Psychiatric: grossly normal mood and affect, speech fluent and appropriate Neurologic: grossly non-focal.  Data Reviewed:  UOP 3575  Basic metabolic panel unremarkable except potassium 3.4  Scheduled Meds: .  stroke: mapping our early stages of recovery book   Does not apply Once  . antiseptic oral rinse  7 mL Mouth Rinse BID  . antiseptic oral rinse  7 mL Mouth Rinse BID  . baclofen  5 mg Oral QHS  .  feeding supplement (RESOURCE BREEZE)  1 Container Oral TID BM  . furosemide  40 mg Oral Daily  . ipratropium-albuterol  3 mL Nebulization QID  . pantoprazole (PROTONIX) IV  40 mg Intravenous QHS  . potassium chloride  10 mEq Oral BID  . senna-docusate  1 tablet Oral BID  . simvastatin  20 mg Oral q1800   Continuous Infusions:   Principal Problem:    Cerebral hemorrhage Active Problems:   Acute combined systolic and diastolic heart failure   Failure to thrive in adult   Time spent 25 minutes

## 2015-01-08 ENCOUNTER — Telehealth: Payer: Self-pay | Admitting: Cardiology

## 2015-01-08 MED ORDER — PANTOPRAZOLE SODIUM 40 MG PO TBEC
40.0000 mg | DELAYED_RELEASE_TABLET | Freq: Every day | ORAL | Status: DC
  Administered 2015-01-09 – 2015-01-10 (×2): 40 mg via ORAL
  Filled 2015-01-08 (×2): qty 1

## 2015-01-08 MED ORDER — LOSARTAN POTASSIUM 50 MG PO TABS
25.0000 mg | ORAL_TABLET | Freq: Every day | ORAL | Status: DC
  Administered 2015-01-08 – 2015-01-10 (×3): 25 mg via ORAL
  Filled 2015-01-08 (×3): qty 1

## 2015-01-08 MED ORDER — POLYETHYLENE GLYCOL 3350 17 G PO PACK
17.0000 g | PACK | Freq: Two times a day (BID) | ORAL | Status: DC
  Administered 2015-01-08 – 2015-01-10 (×4): 17 g via ORAL
  Filled 2015-01-08 (×5): qty 1

## 2015-01-08 MED ORDER — OXYCODONE HCL 5 MG PO TABS
15.0000 mg | ORAL_TABLET | ORAL | Status: DC | PRN
  Administered 2015-01-08 – 2015-01-09 (×4): 15 mg via ORAL
  Filled 2015-01-08 (×4): qty 3

## 2015-01-08 MED ORDER — BISOPROLOL FUMARATE 5 MG PO TABS
5.0000 mg | ORAL_TABLET | Freq: Every day | ORAL | Status: DC
  Administered 2015-01-08 – 2015-01-10 (×3): 5 mg via ORAL
  Filled 2015-01-08 (×5): qty 1

## 2015-01-08 NOTE — Telephone Encounter (Signed)
Mr. Erica Howard made aware that [facility] [place]anch [place]. Mr. Erica Howard would like to know if cardiology consult/follow-up will even be needed as pt will most likely need to be in [facility] Explained that Dr. Irene LimboGoodrich will most likely make that assessment in d/c instructions but that I would forward to cardiologist. Will forward to Dr. Wyline MoodBranch as Lorain ChildesFYI

## 2015-01-08 NOTE — Evaluation (Signed)
Physical Therapy Evaluation Patient Details Name: LYNISE PORR MRN: [id number] DOB: Feb 16, 1927 Today's Date: 01/08/2015   History of Present Illness  With a history of profound deafness, COPD, peripheral vascular disease, hypertension, combined systolic and diastolic heart failure with an EF of 40-45% on 06/2014 with grade 2 diastolic dysfunction. The patient has had a gradual decline over the past couple of weeks with increasing complaints of pain. Although the patient has profound deafness, she is able to read lips fairly well and has been interacting with her daughter. Approximally 2 days ago the patient started having complaints of severe headache, followed by a rapid decline with difficulty ambulating with her walker, decreased verbalization, and a decreased appetite. She has had very little to eat or drink over the past 2 days. The patient's daughter brought her to the hospital for evaluation due to the sudden decline over the past couple of days. There have been no falls according to the daughter. The patient is minimally interactive and does not answer questions.  Clinical Impression   Pt is up in a chair and was initially resistant to my presence.  She was very disoriented, wanted to "go back to my room".  She finally realized that she was confused.  She was alert but only an observational evaluation could be done.  Pt is moving all extremeties equally.  She is able to stand with min assist and ambulate with a walker 15' with supervision..  From chart review, this is her baseline.  Pt wanted to get back to bed and this was the only way I could convince her to try to walk.  She does not appear to be a good rehab candidate and should not need further PT.     Follow Up Recommendations No PT follow up    Equipment Recommendations  None recommended by PT    Recommendations for Other Services   none    Precautions / Restrictions Precautions Precautions: Fall Restrictions Weight Bearing  Restrictions: No      Mobility  Bed Mobility Overal bed mobility: Modified Independent Bed Mobility: Sit to Supine     Supine to sit: Modified independent (Device/Increase time)        Transfers Overall transfer level: Needs assistance Equipment used: Rolling walker (2 wheeled) Transfers: Sit to/from Stand Sit to Stand: Min assist            Ambulation/Gait Ambulation/Gait assistance: Supervision Ambulation Distance (Feet): 15 Feet Assistive device: Rolling walker (2 wheeled) Gait Pattern/deviations: Trunk flexed;Shuffle   Gait velocity interpretation: Below normal speed for age/gender General Gait Details: this is probably her baseline  Careers information officer    Modified Rankin (Stroke Patients Only)       Balance Overall balance assessment: Needs assistance Sitting-balance support: No upper extremity supported;Feet supported Sitting balance-Leahy Scale: Good     Standing balance support: Bilateral upper extremity supported Standing balance-Leahy Scale: Good                               Pertinent Vitals/Pain Pain Assessment: Faces Pain Score: 6  Faces Pain Scale: Hurts even more Pain Location: headache, stomachache, back, feet Pain Descriptors / Indicators: Aching Pain Intervention(s): Limited activity within patient's tolerance;Monitored during session;Repositioned    Home Living Family/patient expects to be discharged to:: Private residence Living Arrangements: Children Available Help at Discharge: Family;Available 24 hours/day Type of Home: House  Home Access: Stairs to enter Entrance Stairs-Rails: Right Entrance Stairs-Number of Steps: 1 Home Layout: One level Home Equipment: Walker - 2 wheels;Cane - single point;Bedside commode;Shower seat      Prior Function Level of Independence: Needs assistance   Gait / Transfers Assistance Needed: pt unable to report but per chart pt ambulates to and from the  bathroom with assist and walker...she is currently at her baseline  ADL's / Homemaking Assistance Needed: unknown  Comments: Pt unable to answer questions regarding prior level of function. Pt did verbalize that she uses a walker at home. Chart states that patient walks to and from bathroom at home. Pt appears to be at baseline.      Hand Dominance   Dominant Hand: Right    Extremity/Trunk Assessment   Upper Extremity Assessment: Defer to OT evaluation           Lower Extremity Assessment: Overall WFL for tasks assessed (based on observation of movement..pt unable to cooperate with MMT)      Cervical / Trunk Assessment: Kyphotic  Communication   Communication: HOH (reads lips well)  Cognition Arousal/Alertness: Awake/alert Behavior During Therapy: Flat affect Overall Cognitive Status: No family/caregiver present to determine baseline cognitive functioning                      General Comments      Exercises        Assessment/Plan    PT Assessment Patent does not need any further PT services  PT Diagnosis     PT Problem List    PT Treatment Interventions     PT Goals (Current goals can be found in the Care Plan section) Acute Rehab PT Goals PT Goal Formulation: All assessment and education complete, DC therapy    Frequency     Barriers to discharge        Co-evaluation               End of Session Equipment Utilized During Treatment: Gait belt;Oxygen Activity Tolerance: Patient tolerated treatment well Patient left: in bed;with bed alarm set;with nursing/sitter in room Nurse Communication: Mobility status         Time: 1017-1040 PT Time Calculation (min) (ACUTE ONLY): 23 min   Charges:   PT Evaluation $Initial PT Evaluation Tier I: 1 Procedure     PT G CodesKonrad Penta:        Mykhia Danish L 01/08/2015, 10:47 AM

## 2015-01-08 NOTE — Evaluation (Signed)
Speech Language Pathology Evaluation Patient Details Name: Erica Howard MRN: [id number] DOB: 1926-12-27 Today's Date: 01/08/2015 Time: [id number] SLP Time Calculation (min) (ACUTE ONLY): 48 min  Problem List:  Patient Active Problem List   Diagnosis Date Noted  . Cerebral hemorrhage 01/05/2015  . Failure to thrive in adult 01/05/2015  . Demand ischemia 07/04/2014  . Heme positive stool 07/04/2014  . Azotemia 07/04/2014  . Acute on chronic respiratory failure 07/03/2014  . Diastolic dysfunction- grade 2 by echo 07/03/2014  . Cardiomyopathy, ? ischemic- EF 40% with WMA 07/03/2014  . Acute combined systolic and diastolic heart failure 07/03/2014  . COPD exacerbation 07/03/2014  . CAD- mild CAD Feb 2014 07/03/2014  . RBBB 07/03/2014  . Smoker 07/03/2014  . ACS (acute coronary syndrome) 07/01/2014  . Hypotension 07/01/2014  . Abdominal pain 07/01/2014  . History of Takotsubo syndrome-Feb 2014 11/23/2012  . Hyponatremia 11/22/2012  . Hypokalemia 11/22/2012  . Hx of PAF-Feb 2014 11/22/2012  . COPD (chronic obstructive pulmonary disease)   . Hypertension    Past Medical History:  Past Medical History  Diagnosis Date  . COPD (chronic obstructive pulmonary disease)   . Peripheral vascular disease   . Hypertension   . Arthritis   . Myocardial infarction 11/20/2012    Non obstructive CAD  . CHF (congestive heart failure) 11/20/2012    EF 35% Takatsubo.  Improved to 60%  . Tobacco abuse    Past Surgical History:  Past Surgical History  Procedure Laterality Date  . No past surgeries    . Left heart catheterization with coronary angiogram N/A 11/22/2012    Procedure: LEFT HEART CATHETERIZATION WITH CORONARY ANGIOGRAM;  Surgeon: Rollene RotundaJames Hochrein, MD;  [facility];  Service: Cardiovascular;  Laterality: N/A;   HPI:  Mrs. Erica Howard is an 81 year old woman who presented to the emergency department after 2 day history of headache, followed by rapid decline with  difficulty ambulating, decreased verbalization and decreased appetite. CT of the head revealed, 3 x 4.5 cm right temporal hematoma with probable blood within both occipital horns. Pt passed RN swallow screen. SLP asked to evaluate via SLE as part of stroke protocol. No family present during the evaluation. Pt very HOH and hearing aids are at home.    Assessment / Plan / Recommendation Clinical Impression  Mrs. Erica Howard is extremely HOH and typically wears hearing aids at home, but left them at home "because they are so expensive and I didn't want to lose them". She was seen during lunch meal and showed no signs or symptoms of aspiration, however did have some difficulty masticating the meat on her tray so I would suggest downgrading to D3/mech soft for ease of mastication. Formal cognitive linguistic evaluation was difficult to administer due to severity of hearing loss, however skills judged to be essesentially WFL during informal assessment. Pt denies any changes in swallowing, speech, word finding, or memory. Orientation was slightly impaired, stating it was "February" and eventually self correcting to "March". Pt reported that she lives at home with her husband, Erica RuizJohn. She states that she worries about him sometimes because he seems to have "some memory difficulties". Pt does a fairly good job at reading lips, but when we reached a break down in communication she could easily read a written message from SLP. SLP left a communication board at bedside after reviewing with pt so that staff could use when needed (due to severity of hearing loss). No further SLP services indicated at this time.  SLP Assessment  Patient does not need any further Speech Lanaguage Pathology Services    Follow Up Recommendations  None    Frequency and Duration  N/A      Pertinent Vitals/Pain Pain Assessment: 0-10 Pain Score: 8  Pain Location: head Pain Intervention(s): RN gave pain meds during session   SLP Goals   Patient/Family Stated Goal: Pt wants to go back home after discharge.  SLP Evaluation Prior Functioning  Cognitive/Linguistic Baseline: Information not available Type of Home: House  Lives With: Spouse Available Help at Discharge: Family   Cognition  Overall Cognitive Status: No family/caregiver present to determine baseline cognitive functioning Arousal/Alertness: Awake/alert Orientation Level: Oriented to person;Oriented to place;Oriented to situation;Disoriented to time (said it was "February" but self corrected to "March") Memory: Appears intact (pt initially confused earlier in the day per chart review) Awareness: Appears intact Problem Solving: Appears intact Safety/Judgment: Appears intact Comments:  (Pt verbalized that she lives with her husband, Erica Howard)    Comprehension  Auditory Comprehension Overall Auditory Comprehension: Impaired Yes/No Questions: Within Functional Limits Commands: Impaired Multistep Basic Commands: 50-74% accurate Conversation: Simple Other Conversation Comments:  (Pt HOH and does not have hearing aids present) Interfering Components: Hearing EffectiveTechniques: Visual/Gestural cues;Increased volume;Stressing words (written cues) Visual Recognition/Discrimination Discrimination: Within Function Limits Reading Comprehension Reading Status: Within funtional limits    Expression Expression Primary Mode of Expression: Verbal Verbal Expression Overall Verbal Expression: Appears within functional limits for tasks assessed Initiation: No impairment Level of Generative/Spontaneous Verbalization: Conversation Repetition: No impairment Naming: No impairment Pragmatics: No impairment Non-Verbal Means of Communication: Not applicable Written Expression Dominant Hand: Right Written Expression: Not tested   Oral / Motor Oral Motor/Sensory Function Overall Oral Motor/Sensory Function: Appears within functional limits for tasks assessed Labial ROM:  Within Functional Limits Labial Symmetry: Within Functional Limits Labial Strength: Within Functional Limits Labial Sensation: Within Functional Limits Lingual ROM: Within Functional Limits Lingual Symmetry: Within Functional Limits Lingual Strength: Within Functional Limits Lingual Sensation: Within Functional Limits Facial ROM: Within Functional Limits Facial Symmetry: Within Functional Limits Facial Strength: Within Functional Limits Facial Sensation: Within Functional Limits Velum: Within Functional Limits Mandible:  (difficulty with mastication due to dentures) Motor Speech Overall Motor Speech: Appears within functional limits for tasks assessed Respiration: Within functional limits Phonation: Normal Resonance: Within functional limits Articulation: Within functional limitis Intelligibility: Intelligible Motor Planning: Witnin functional limits Motor Speech Errors: Not applicable   Thank you,  Havery Moros, CCC-SLP [phone]      Journe Hallmark 01/08/2015, 4:29 PM

## 2015-01-08 NOTE — Progress Notes (Signed)
PROGRESS NOTE  Erica Howard ZOX:[id number] DOB: 1927/03/07 DOA: 01/05/2015 PCP: Catalina Pizza, MD  Summary: 81 year old woman presented to the emergency department after 2 day history of headache, followed by rapid decline with difficulty ambulating, decreased verbalization and decreased appetite. CT of the head revealed temporal hemorrhage, EDP discussed with neurosurgery, based on goals care patient was admitted to [facility] with goal of conservative care.  Assessment/Plan: 1. Right temporal cerebral bleed. Clinically improved. Only symptom is headache. Appears to be at baseline. No focal neurologic deficits. Physical and occupational therapy recommended no further treatment. EDP d/w neurosurgery but based on goals of care patient was admitted to [facility]. Note no palliative care consultation is available until 3/22. No history of fall, suspect related to hypertensive disease. Was on ASA prior to admission. 2. HTN. Goal SBP 160/90. Stable. 3. Acute on chronic combined systolic and diastolic CHF, LVEF 40-45% 06/2014. Acute component appears resolved.  4. Normocytic anemia. No evidence of bleeding. Given goals of care no further evaluation suggested. 5. COPD. Appears stable. 6. Constipation. Bowel regimen. 7. Deaf  8. Sacral wound present on admission   Appears stable neurologically. Heart failure now appears to be at baseline.   Main complaint is headache. Has been on chronic narcotics for many years. Plan to start oxycodone at higher dose.  Will plan to lower blood pressure further. Hold off on imaging unless clinical condition changes.   Discussed in detail with daughter. She would like to discuss her mother's care with palliative care is also interested in hospice referral. We discussed therapy recommendations and daughter is considering either taking the patient home with private pay 24-hour care or private pay skilled nursing care.  She is primarily interested in symptom management and that the  patient be kept comfortable.   Anticipate discharge 3/22.  Code Status: DNR DVT prophylaxis: SCDs Family Communication: daughter/POA Maylon Cos [phone]  Disposition Plan: pending  Brendia Sacks, MD  [facility]  Pager [phone] If 7PM-7AM, please contact night-coverage at [email or web address], password [facility] 01/08/2015, 2:59 PM  LOS: 3 days   Consultants:  PT: no follow-up  OT: no follow-up  Procedures:    Antibiotics:    HPI/Subjective: Physical and occupational therapy and recommended no rehabilitation.  Complains of headache and some abd pain, no BM since admission.  Objective: Filed Vitals:   01/07/15 2157 01/08/15 0641 01/08/15 0750 01/08/15 1123  BP: 159/56 150/59    Pulse: 78 72    Temp: 98.9 F (37.2 C) 98.8 F (37.1 C)    TempSrc: Oral Oral    Resp: 20 20    Height:      Weight:      SpO2: 95% 99% 94% 92%    Intake/Output Summary (Last 24 hours) at 01/08/15 1459 Last data filed at 01/07/15 1700  Gross per 24 hour  Intake    120 ml  Output   1900 ml  Net  -1780 ml     Filed Weights   01/05/15 1741 01/06/15 0038  Weight: 54.432 kg (120 lb) 73.483 kg (162 lb)    Exam:  Afebrile, vital signs stable. Blood pressure has been controlled with systolic less than 160 and diastolic less than 90. General:  Appears calm and comfortable Eyes: pupils, lids appear unremarkable Cardiovascular: RRR, no m/r/g. No LE edema. Telemetry: SR, no arrhythmias  Respiratory: CTA bilaterally, no w/r/r. Mild increased respiratory effort. Speaks in full sentences. Musculoskeletal: grossly normal tone BUE/BLE. Moves all extremities to command Psychiatric: grossly normal mood and affect,  speech fluent and appropriate Neurologic: CN appear intact, grossly non-focal  Data Reviewed:  UOP 1900  Scheduled Meds: .  stroke: mapping our early stages of recovery book   Does not apply Once  . antiseptic oral rinse  7 mL Mouth Rinse BID  . antiseptic oral rinse   7 mL Mouth Rinse BID  . baclofen  5 mg Oral QHS  . feeding supplement (RESOURCE BREEZE)  1 Container Oral TID BM  . furosemide  40 mg Oral Daily  . ipratropium-albuterol  3 mL Nebulization QID  . pantoprazole (PROTONIX) IV  40 mg Intravenous QHS  . potassium chloride  10 mEq Oral BID  . senna-docusate  1 tablet Oral BID  . simvastatin  20 mg Oral q1800   Continuous Infusions:   Principal Problem:   Cerebral hemorrhage Active Problems:   Acute combined systolic and diastolic heart failure   Failure to thrive in adult

## 2015-01-08 NOTE — Plan of Care (Signed)
Problem: Phase II Progression Outcomes Goal: Progress activity as tolerated unless otherwise ordered Outcome: Completed/Met Date Met:  01/08/15 Out of bed to chair.

## 2015-01-08 NOTE — Evaluation (Signed)
Occupational Therapy Evaluation Patient Details Name: Erica Howard MRN: [id number] DOB: 04-Apr-1927 Today's Date: 01/08/2015    History of Present Illness With a history of profound deafness, COPD, peripheral vascular disease, hypertension, combined systolic and diastolic heart failure with an EF of 40-45% on 06/2014 with grade 2 diastolic dysfunction. The patient has had a gradual decline over the past couple of weeks with increasing complaints of pain. Although the patient has profound deafness, she is able to read lips fairly well and has been interacting with her daughter. Approximally 2 days ago the patient started having complaints of severe headache, followed by a rapid decline with difficulty ambulating with her walker, decreased verbalization, and a decreased appetite. She has had very little to eat or drink over the past 2 days. The patient's daughter brought her to the hospital for evaluation due to the sudden decline over the past couple of days. There have been no falls according to the daughter. The patient is minimally interactive and does not answer questions.   Clinical Impression   Pt completed OT evaluation this AM. Patient had difficulty reading lips and required visual and physical cues to complete tasks. Patient unable to give information regarding prior level of care. Social work states that patient lives with daughter. Chart states that patient walks to an from bathroom with a walker at home. Patient appears to be at baseline and I am not recommending any OT follow-up. I do not believe patient would tolerate any therapy services. Recommend discharge home with daughter providing 24/7 care.     Follow Up Recommendations  No OT follow up    Equipment Recommendations  None recommended by OT    Recommendations for Other Services PT consult     Precautions / Restrictions Precautions Precautions: Fall Restrictions Weight Bearing Restrictions: No      Mobility Bed  Mobility Overal bed mobility: Needs Assistance Bed Mobility: Supine to Sit     Supine to sit: HOB elevated;Min assist        Transfers Overall transfer level: Needs assistance Equipment used: Rolling walker (2 wheeled) Transfers: Sit to/from Stand Sit to Stand: Min assist;From elevated surface                   ADL Overall ADL's : At baseline                                       General ADL Comments: Donning/doffing socks: Total A. Functional transfer bed to recliner: Min A with RW. Pt required verbal, physical, and visual cues to complete transfer. Transfer completed from elevated surface.                Pertinent Vitals/Pain Pain Assessment: Faces Faces Pain Scale: Hurts whole lot Pain Location: bilateral feet when touched Pain Intervention(s): Repositioned     Hand Dominance Right   Extremity/Trunk Assessment Upper Extremity Assessment Upper Extremity Assessment: Overall WFL for tasks assessed   Lower Extremity Assessment Lower Extremity Assessment: Defer to PT evaluation       Communication Communication Communication: HOH   Cognition Arousal/Alertness: Awake/alert Behavior During Therapy: WFL for tasks assessed/performed Overall Cognitive Status: No family/caregiver present to determine baseline cognitive functioning                                Home Living Family/patient expects to  be discharged to:: Unsure Living Arrangements: Children                                      Prior Functioning/Environment Level of Independence: Needs assistance        Comments: Pt unable to answer questions regarding prior level of function. Pt did verbalize that she uses a walker at home. Chart states that patient walks to and from bathroom at home. Pt appears to be at baseline.                               End of Session Equipment Utilized During Treatment: Gait belt;Rolling walker Nurse  Communication: Mobility status  Activity Tolerance: Patient tolerated treatment well Patient left: in chair;with call bell/phone within reach;with chair alarm set   Time: 0825-0900 OT Time Calculation (min): 35 min Charges:  OT General Charges $OT Visit: 1 Procedure OT Evaluation $Initial OT Evaluation Tier I: 1 Procedure G-Codes:    Limmie Patricia, OTR/L,CBIS  [phone]  01/08/2015, 9:19 AM

## 2015-01-08 NOTE — Care Management Note (Addendum)
    Page 1 of 1   01/09/2015     11:55:43 AM CARE MANAGEMENT NOTE 01/09/2015  Patient:  Mayer MaskerSTOVALL,Decie S   Account Number:  [id number]  Date Initiated:  01/08/2015  Documentation initiated by:  Sharrie RothmanBLACKWELL,Rockie Schnoor C  Subjective/Objective Assessment:   Pt admitted from home with cerebral hemorrhage. Pt lives with her husband and daughter. Pt has  a walker for home use. Pt is active with [employer] RN and aide.     Action/Plan:   Will continue to follow for discharge planning needs. Pts daughter is unsure if [facility] or pay private for rehab. Pt is aware that by tomorrow pt will be ready for discharge.   Anticipated DC Date:  01/09/2015   Anticipated DC Plan:  HOME W HOME HEALTH SERVICES      DC Planning Services  CM consult      [facility]   Choice offered to / List presented to:  C-4 Adult Children           Status of service:  Completed, signed off Medicare Important Message given?  YES (If response is "NO", the following Medicare IM given date fields will be blank) Date Medicare IM given:  01/08/2015 Medicare IM given by:  Sharrie RothmanBLACKWELL,Minyon Billiter C Date Additional Medicare IM given:   Additional Medicare IM given by:    Discharge Disposition:  HOME W HOSPICE CARE  Per UR Regulation:    If discussed at Long Length of Stay Meetings, dates discussed:    Comments:  01/09/15 1150 Arlyss Queenammy Lakai Moree, RN BSN CM Palliative care consult obtained and Hospice referral initiated per Dr. Phillips OdorGolding. [facility]. [facility]. They will contact pts daughter and arrange delivery of DME. Pt is congested and SOB today. Anticipate discharge within 24 hours.  01/08/15 1430 Arlyss Queenammy Kayler Rise, RN BSN CM

## 2015-01-09 ENCOUNTER — Ambulatory Visit: Payer: Self-pay | Admitting: Cardiology

## 2015-01-09 DIAGNOSIS — R627 Adult failure to thrive: Secondary | ICD-10-CM

## 2015-01-09 MED ORDER — MORPHINE SULFATE (CONCENTRATE) 10 MG/0.5ML PO SOLN
10.0000 mg | ORAL | Status: DC | PRN
  Administered 2015-01-09 – 2015-01-10 (×2): 10 mg via ORAL
  Filled 2015-01-09 (×2): qty 0.5

## 2015-01-09 MED ORDER — FUROSEMIDE 10 MG/ML IJ SOLN
20.0000 mg | Freq: Once | INTRAMUSCULAR | Status: AC
  Administered 2015-01-09: 20 mg via INTRAVENOUS
  Filled 2015-01-09: qty 2

## 2015-01-09 MED ORDER — IPRATROPIUM-ALBUTEROL 0.5-2.5 (3) MG/3ML IN SOLN
3.0000 mL | Freq: Three times a day (TID) | RESPIRATORY_TRACT | Status: DC
  Administered 2015-01-09 – 2015-01-10 (×3): 3 mL via RESPIRATORY_TRACT
  Filled 2015-01-09 (×3): qty 3

## 2015-01-09 NOTE — Progress Notes (Signed)
Peri/catheter care performed. 

## 2015-01-09 NOTE — Progress Notes (Signed)
UR chart review completed.  

## 2015-01-09 NOTE — Plan of Care (Signed)
Problem: Progression Outcomes Goal: Bowel & Bladder Continence Outcome: Progressing Pt is continent of stool, currently has urinary catheter

## 2015-01-09 NOTE — Consult Note (Signed)
Palliative Medicine Team Consult Note  Spoke in detail with patient's daughter Clydie BraunKaren and reviewed her chart extensively. Audia has been declining rapidly in terms of her functional status for the past year- she requires a high level of assistance at home and her husband who lives with her has significant vascular dementia. Her cerebral hemorrhage coupled with heart failure and severe PVD puts her at high risk for death in the next 6 months and an even more rapid decline in health. Stated goals per Clydie BraunKaren are for both of her parents to remain in their home for whatever their remaining time is and to be comfortable with a conservative approach to medical interventions- no rehospitalization, comfort and QOL- not unnecessarily prolonging what is a inevitable decline.   Summary:  1. DNR, Romeo RabonGolden Rod should accompany patient home. 2. Hospice referral would be most appropriate- prognosis <6 months, will need careful attention to her chronic pain management-multi-level compression fracture history, dyspnea and peripheral edema from heart failure and pain from PVD well within the hospice scope. Hospice could help achieve their goals for dignity and comfort at and approaching EOL. 3. They will supplement care with paid in home private duty care givers- please give info on this service. 4. Provide information on caregiver support programs through [facility] can also support.  Referral for hospice services today. I will evaluate Mirela tomorrow to update her pain medication and complete a MOST from to reflect our discussion regarding escalation of medical interventions.  Plan discharge for tomorrow noted.  Anderson MaltaElizabeth Golding, DO Palliative Medicine [phone]

## 2015-01-09 NOTE — Progress Notes (Signed)
PROGRESS NOTE  Erica Howard UJW:[id number] DOB: [birth year] DOA: 01/05/2015 PCP: Catalina Pizza, MD  Summary: 81 year old woman presented to the emergency department after 2 day history of headache, followed by rapid decline with difficulty ambulating, decreased verbalization and decreased appetite. CT of the head revealed temporal hemorrhage, EDP discussed with neurosurgery, based on goals care patient was admitted to [facility] with goal of conservative care.  Assessment/Plan: 1. Right temporal cerebral bleed. Clinically improved. Only symptom is headache. Appears to be at baseline. No focal neurologic deficits. Physical and occupational therapy recommended no further treatment. EDP d/w neurosurgery but based on goals of care patient was admitted to [facility]. No history of fall, suspect related to hypertensive disease. Was on ASA prior to admission. No further work up planned. 2. HTN. Goal SBP 160/90. Stable. 3. Acute on chronic combined systolic and diastolic CHF, LVEF 40-45% 06/2014. Patient continues to appear mildly short of breath with crackles at bases. Will give an extra dose of intravenous lasix today.  4. Normocytic anemia. No evidence of bleeding. Given goals of care no further evaluation suggested. 5. COPD. Appears stable. 6. Constipation. Bowel regimen. 7. Deaf  8. Sacral wound present on admission 9. Goals of care. Palliative care consult appreciated. Patient's daughter wishes that the patient return home with hospice services. Focus of care will be on the patient's comfort and end-of-life issues. Will change pain medication oxycodone to oral Roxanol which can be further titrated. Appreciate palliative care services with pain management. Her long-term prognosis appears poor.  Code Status: DNR DVT prophylaxis: SCDs Family Communication: daughter/POA Maylon Cos [phone]  Disposition Plan: likely discharge home with hospice services, likely in AM  Erick Blinks, MD  [facility]    Pager [phone] If 7PM-7AM, please contact night-coverage at [email or web address], password [facility] 01/09/2015, 6:16 PM  LOS: 4 days   Consultants:  PT: no follow-up  OT: no follow-up  Palliative care  Procedures:    Antibiotics:    HPI/Subjective: Feels that she is not breathing well. Poor historian, very hard of hearing. Continues to complain of headache.  Objective: Filed Vitals:   01/09/15 0530 01/09/15 0734 01/09/15 1441 01/09/15 1730  BP: 182/52   164/69  Pulse: 85   90  Temp: 98.3 F (36.8 C)   98.1 F (36.7 C)  TempSrc: Oral     Resp: 20   20  Height:      Weight:      SpO2: 91% 96% 93% 94%    Intake/Output Summary (Last 24 hours) at 01/09/15 1816 Last data filed at 01/09/15 1711  Gross per 24 hour  Intake    360 ml  Output   2250 ml  Net  -1890 ml     Filed Weights   01/05/15 1741 01/06/15 0038  Weight: 54.432 kg (120 lb) 73.483 kg (162 lb)    Exam:  Afebrile, vital signs stable.  General:  Appears calm and comfortable Eyes: pupils, lids appear unremarkable Cardiovascular: RRR, no m/r/g. No LE edema. Telemetry: SR, no arrhythmias  Respiratory: crackles at bases, no w/r/r. Mild increased respiratory effort. Speaks in full sentences. Musculoskeletal: grossly normal tone BUE/BLE. Moves all extremities to command Psychiatric: grossly normal mood and affect, speech fluent and appropriate Neurologic: CN appear intact, grossly non-focal   Scheduled Meds: . antiseptic oral rinse  7 mL Mouth Rinse BID  . antiseptic oral rinse  7 mL Mouth Rinse BID  . baclofen  5 mg Oral QHS  . bisoprolol  5 mg Oral Daily  .  feeding supplement (RESOURCE BREEZE)  1 Container Oral TID BM  . furosemide  40 mg Oral Daily  . ipratropium-albuterol  3 mL Nebulization TID  . losartan  25 mg Oral Daily  . pantoprazole  40 mg Oral Daily  . polyethylene glycol  17 g Oral BID  . potassium chloride  10 mEq Oral BID  . senna-docusate  1 tablet Oral BID  . simvastatin  20 mg Oral  q1800   Continuous Infusions:   Principal Problem:   Cerebral hemorrhage Active Problems:   Acute combined systolic and diastolic heart failure   Failure to thrive in adult

## 2015-01-10 DIAGNOSIS — R531 Weakness: Secondary | ICD-10-CM

## 2015-01-10 MED ORDER — MORPHINE SULFATE ER 15 MG PO TBCR: 15.0000 mg | EXTENDED_RELEASE_TABLET | Freq: Two times a day (BID) | ORAL | Status: AC

## 2015-01-10 MED ORDER — MORPHINE SULFATE (CONCENTRATE) 10 MG/0.5ML PO SOLN: 10.0000 mg | ORAL | Status: AC | PRN

## 2015-01-10 MED ORDER — SENNOSIDES-DOCUSATE SODIUM 8.6-50 MG PO TABS: 1.0000 | ORAL_TABLET | Freq: Two times a day (BID) | ORAL | Status: AC

## 2015-01-10 MED ORDER — MORPHINE SULFATE ER 15 MG PO TBCR
15.0000 mg | EXTENDED_RELEASE_TABLET | Freq: Two times a day (BID) | ORAL | Status: DC
  Administered 2015-01-10: 15 mg via ORAL
  Filled 2015-01-10: qty 1

## 2015-01-10 NOTE — Progress Notes (Signed)
Peri/catheter care performed.

## 2015-01-10 NOTE — Discharge Summary (Signed)
Physician Discharge Summary  Erica Howard ZOX:[id number] DOB: [birth year] DOA: 01/05/2015  PCP: Catalina Pizza, MD  Admit date: 01/05/2015 Discharge date: 01/10/2015  Time spent: 40 minutes  Recommendations for Outpatient Follow-up:  1. Patient will be discharged home with hospice services. Per palliative consult, patient's daughter does not wish for the patient to be rehospitalized. Primary goal of her care would be comfort and quality of life.  Discharge Diagnoses:  Principal Problem:   Cerebral hemorrhage Active Problems:   Acute combined systolic and diastolic heart failure   Failure to thrive in adult   Weakness   Discharge Condition: stable  Diet recommendation: regular diet for comfort  Filed Weights   01/05/15 1741 01/06/15 0038  Weight: 54.432 kg (120 lb) 73.483 kg (162 lb)    History of present illness:  This patient presents to the hospital with a severe headache followed by rapid decline and difficulty ambulating. She was evaluated in the emergency room and found to have a right temporal lobe hematoma. She was admitted for further treatments . Patient's daughter requested conservative management and comfort care. Case was discussed with neurosurgery, who recommended conservative management based on goals of care.  Hospital Course:  Patient was monitored in the hospital. Aspirin was discontinued. She received pain management to help with her headache. She was noted to have some evidence of acute on chronic combined congestive heart failure and received intravenous Lasix. Since then, she appears to be approaching bulimia. The patient has profound hearing loss which affects her quality of life. With her multiple comorbidities, palliative care consultation was requested. After meeting with the daughter, it was clear that the primary goal for the patient's care was her comfort and quality of life. Her daughter does not wish to pursue aggressive treatments at this time. Further  discussions indicated that patient's daughter would be very interested in pursuing hospice services at home. This has been arranged. Her pain medication has been adjusted. Plans are to discharge home today with hospice services for further comfort/end-of-life care.  Procedures:    Consultations:  Palliative care  Discharge Exam: Filed Vitals:   01/10/15 1452  BP: 130/53  Pulse: 58  Temp: 97.9 F (36.6 C)  Resp: 20    General: NAD Cardiovascular: S1, S2 RRR Respiratory: CTA B  Discharge Instructions   Discharge Instructions    Diet - low sodium heart healthy    Complete by:  As directed      Increase activity slowly    Complete by:  As directed           Discharge Medication List as of 01/10/2015  2:21 PM    START taking these medications   Details  morphine (MS CONTIN) 15 MG 12 hr tablet Take 1 tablet (15 mg total) by mouth every 12 (twelve) hours., Starting 01/10/2015, Until Discontinued, Print    Morphine Sulfate (MORPHINE CONCENTRATE) 10 MG/0.5ML SOLN concentrated solution Take 0.5 mLs (10 mg total) by mouth every hour as needed for severe pain., Starting 01/10/2015, Until Discontinued, Print    senna-docusate (SENOKOT-S) 8.6-50 MG per tablet Take 1 tablet by mouth 2 (two) times daily., Starting 01/10/2015, Until Discontinued, Print      CONTINUE these medications which have NOT CHANGED   Details  albuterol (VENTOLIN HFA) 108 (90 BASE) MCG/ACT inhaler Inhale 1 puff into the lungs as needed for wheezing or shortness of breath., Until Discontinued, Historical Med    allopurinol (ZYLOPRIM) 100 MG tablet Take 100 mg by mouth daily., Until Discontinued,  Historical Med    ALPRAZolam (XANAX) 0.25 MG tablet Take 0.25 mg by mouth 2 (two) times daily as needed for anxiety., Starting 11/26/2012, Until Discontinued, Historical Med    baclofen (LIORESAL) 10 MG tablet Take 5 mg by mouth at bedtime., Until Discontinued, Historical Med    bisoprolol (ZEBETA) 5 MG tablet Take 1  tablet (5 mg total) by mouth daily., Starting 07/07/2014, Until Discontinued, Print    calcium-vitamin D (OSCAL WITH D) 500-200 MG-UNIT per tablet Take 1 tablet by mouth 2 (two) times daily., Until Discontinued, Historical Med    cholecalciferol (VITAMIN D) 1000 UNITS tablet Take 1,000 Units by mouth every evening., Until Discontinued, Historical Med    fexofenadine (ALLEGRA) 180 MG tablet Take 180 mg by mouth every morning., Until Discontinued, Historical Med    furosemide (LASIX) 40 MG tablet Take 40 mg by mouth daily., Until Discontinued, Historical Med    Ipratropium-Albuterol (COMBIVENT RESPIMAT) 20-100 MCG/ACT AERS respimat Inhale 1 puff into the lungs daily., Until Discontinued, Historical Med    losartan (COZAAR) 25 MG tablet Take 1 tablet (25 mg total) by mouth daily., Starting 07/07/2014, Until Discontinued, Print    Magnesium 250 MG TABS Take 1 tablet by mouth every evening., Until Discontinued, Historical Med    nitroGLYCERIN (NITROSTAT) 0.4 MG SL tablet Place 1 tablet (0.4 mg total) under the tongue every 5 (five) minutes as needed for chest pain., Starting 03/18/2013, Until Discontinued, Normal    ondansetron (ZOFRAN-ODT) 4 MG disintegrating tablet Take 4 mg by mouth 2 (two) times daily., Until Discontinued, Historical Med    pantoprazole (PROTONIX) 40 MG tablet Take 1 tablet (40 mg total) by mouth 2 (two) times daily., Starting 07/07/2014, Until Discontinued, Print    polyethylene glycol (MIRALAX / GLYCOLAX) packet Take 17 g by mouth at bedtime., Until Discontinued, Historical Med    potassium chloride (KLOR-CON) 8 MEQ tablet Take 8 mEq by mouth daily., Until Discontinued, Historical Med    Probiotic Product (PROBIOTIC DAILY PO) Take 1 capsule by mouth every morning. , Until Discontinued, Historical Med    triamcinolone cream (KENALOG) 0.1 % Apply 1 application topically daily as needed (for rash). , Starting 01/21/2013, Until Discontinued, Historical Med    levalbuterol  (XOPENEX) 1.25 MG/0.5ML nebulizer solution Take 1.25 mg by nebulization every 6 (six) hours as needed for wheezing or shortness of breath., Starting 07/07/2014, Until Discontinued, No Print      STOP taking these medications     aspirin EC 81 MG tablet      HYDROcodone-acetaminophen (NORCO) 10-325 MG per tablet      Multiple Vitamins-Minerals (SENIOR MULTIVITAMIN PLUS) TABS      simvastatin (ZOCOR) 20 MG tablet      ondansetron (ZOFRAN) 4 MG tablet      predniSONE (DELTASONE) 20 MG tablet        Allergies  Allergen Reactions  . Ibuprofen Rash   Follow-up Information    Follow up with [facility].   Specialty:  Home Health Services   Contact information:   [address] [place] [address] [phone]        The results of significant diagnostics from this hospitalization (including imaging, microbiology, ancillary and laboratory) are listed below for reference.    Significant Diagnostic Studies: Ct Head Wo Contrast  01/05/2015   CLINICAL DATA:  81 year old female altered mental status and nausea [place] and vomiting for 2 days.  EXAM: CT HEAD WITHOUT CONTRAST  TECHNIQUE: Contiguous axial images were obtained  from the base of the skull through the vertex without intravenous contrast.  COMPARISON:  None.  FINDINGS: A 3 x 4.5 cm right temporal hematoma is noted with adjacent edema. There appears to be hemorrhage within both occipital horns.  There is no evidence of midline shift or hydrocephalus.  Moderate chronic small-vessel white matter ischemic changes are identified.  Bilateral mastoid effusions are identified.  No acute bony abnormalities are present.  IMPRESSION: 3 x 4.5 cm right temporal hematoma with probable blood within both occipital horns. No evidence of midline shift or hydrocephalus.  Chronic small-vessel white matter ischemic changes and bilateral mastoid effusions.  Critical Value/emergent results were called by telephone at the time of  interpretation on 01/05/2015 at 9:17 pm to Dr. Donnetta HutchingBRIAN COOK , who verbally acknowledged these results.   Electronically Signed   By: Harmon PierJeffrey  Hu M.D.   On: 01/05/2015 21:20   Dg Chest Port 1 View  01/05/2015   CLINICAL DATA:  Bilateral leg swelling, nausea/ vomiting, weakness  EXAM: PORTABLE CHEST - 1 VIEW  COMPARISON:  07/01/2014  FINDINGS: Cardiomegaly with mild interstitial edema. No definite pleural effusions. No pneumothorax.  IMPRESSION: Cardiomegaly with mild interstitial edema.   Electronically Signed   By: Charline BillsSriyesh  Krishnan M.D.   On: 01/05/2015 18:30    Microbiology: No results found for this or any previous visit (from the past 240 hour(s)).   Labs: Basic Metabolic Panel:  Recent Labs Lab 01/05/15 1814 01/07/15 0631  NA 136 139  K 4.3 3.4*  CL 100 99  CO2 29 31  GLUCOSE 118* 119*  BUN 25* 27*  CREATININE 0.98 0.96  CALCIUM 10.3 9.8   Liver Function Tests: No results for input(s): AST, ALT, ALKPHOS, BILITOT, PROT, ALBUMIN in the last 168 hours. No results for input(s): LIPASE, AMYLASE in the last 168 hours. No results for input(s): AMMONIA in the last 168 hours. CBC:  Recent Labs Lab 01/05/15 1814  WBC 10.0  NEUTROABS 8.1*  HGB 9.4*  HCT 29.3*  MCV 89.3  PLT 243   Cardiac Enzymes:  Recent Labs Lab 01/05/15 1814  TROPONINI <0.03   BNP: BNP (last 3 results) No results for input(s): BNP in the last 8760 hours.  ProBNP (last 3 results)  Recent Labs  06/30/14 2210 07/04/14 0116  PROBNP 4472.0* 19784.0*    CBG: No results for input(s): GLUCAP in the last 168 hours.     Signed:  MEMON,JEHANZEB  [facility] 01/10/2015, 7:13 PM

## 2015-01-11 NOTE — Progress Notes (Signed)
UR chart review completed.  

## 2015-02-18 DEATH — deceased

## 2015-04-16 ENCOUNTER — Other Ambulatory Visit: Payer: Self-pay

## 2015-09-02 IMAGING — CR DG CHEST 1V PORT
1 series · 1 of 1 positions shown · non-contrast
Comparison: 07/01/2014

CLINICAL DATA: Bilateral leg swelling, nausea/ vomiting, weakness

EXAM:
PORTABLE CHEST - 1 VIEW

[portable]
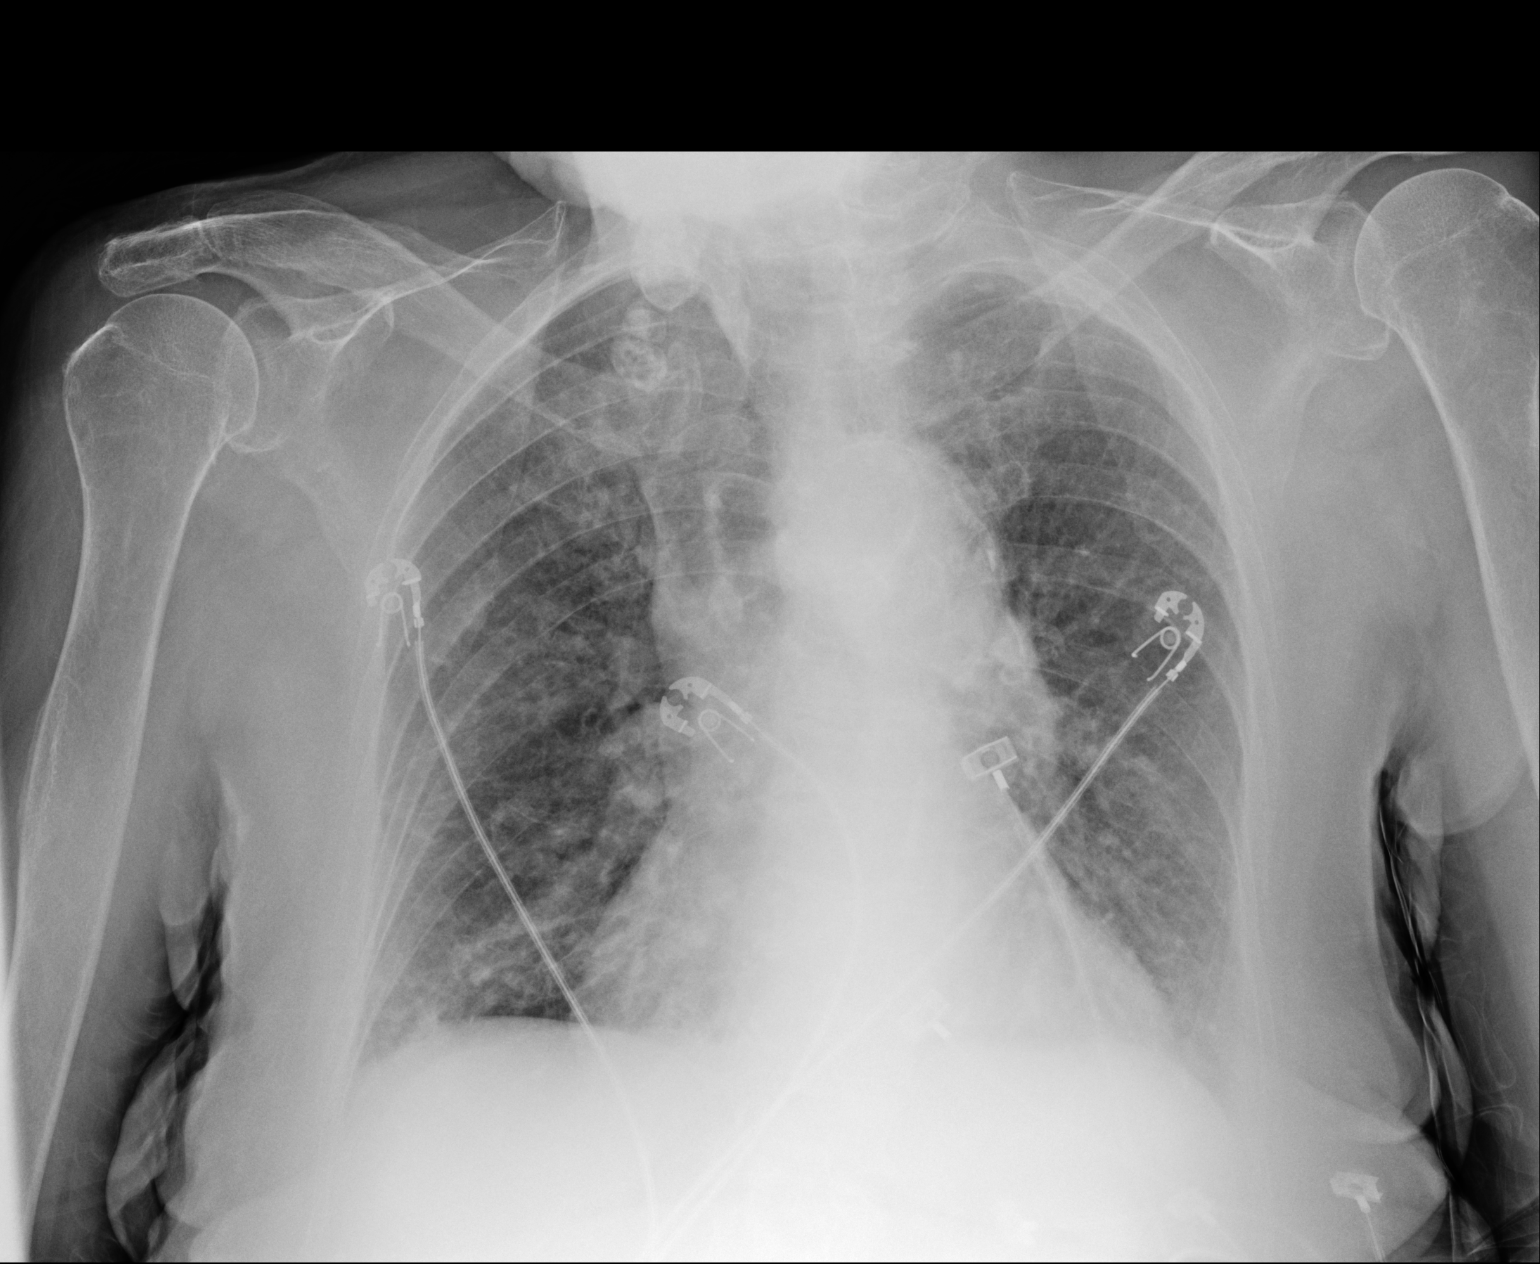

[1 of 1 positions shown; findings below may reference images not displayed]

FINDINGS: Cardiomegaly with mild interstitial edema. No definite pleural
effusions. No pneumothorax.
IMPRESSION: Cardiomegaly with mild interstitial edema.

## 2015-09-02 IMAGING — CT CT HEAD W/O CM
1 series · 16 of 30 positions shown, 20 images · non-contrast
Comparison: None.

CLINICAL DATA: 87-year-old female altered mental status and nausea
[place] and vomiting for 2 days.

EXAM:
CT HEAD WITHOUT CONTRAST
TECHNIQUE: Contiguous axial images were obtained from the base of the skull
through the vertex without intravenous contrast.

[Series 2: headtrauma 4.8 h37s · axial · 0.43mm/px · z∈[-452,-297]mm · 16 of 36 slices shown, 20 images]
[im 2/36  brain]
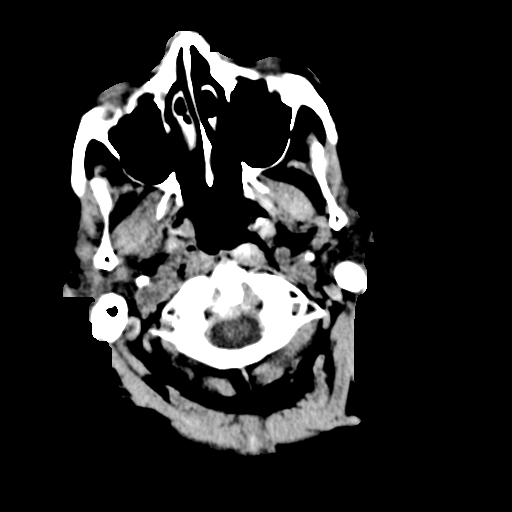
[im 2/36  bone]
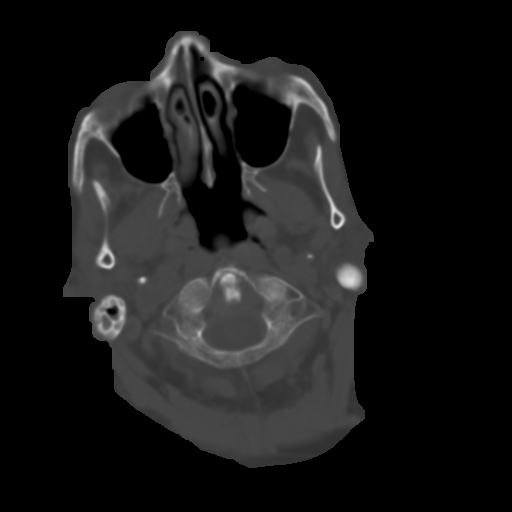
[im 4/36  brain]
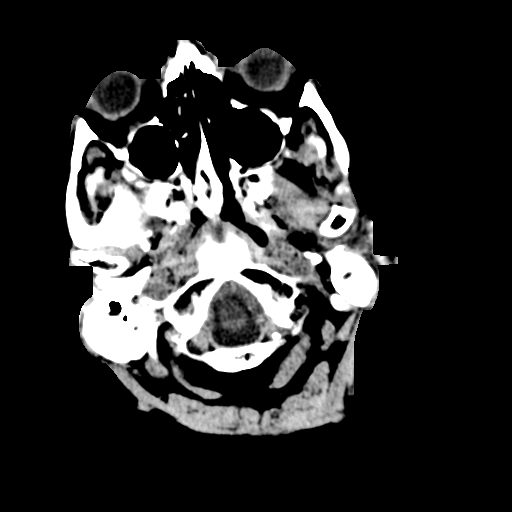
[im 7/36  brain]
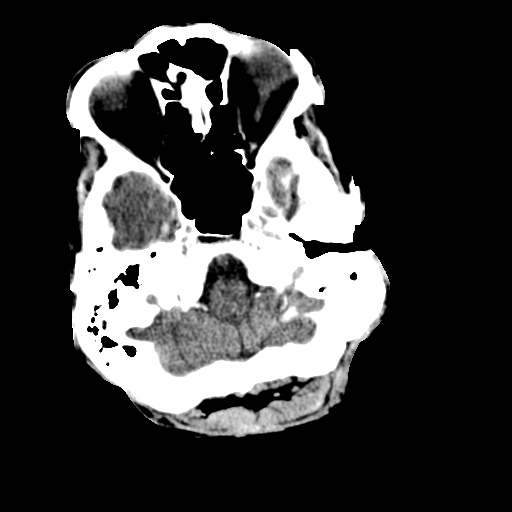
[im 9/36  brain]
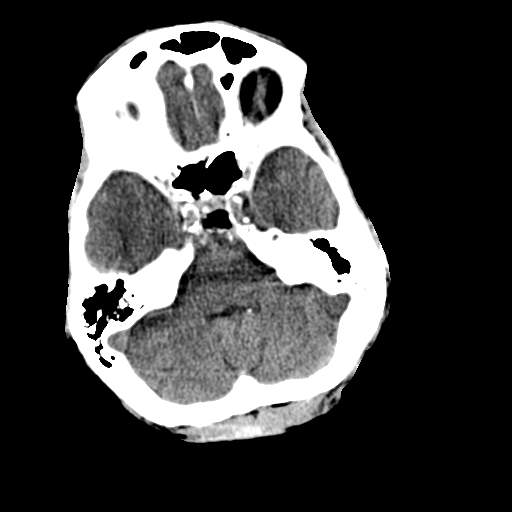
[im 10/36  brain]
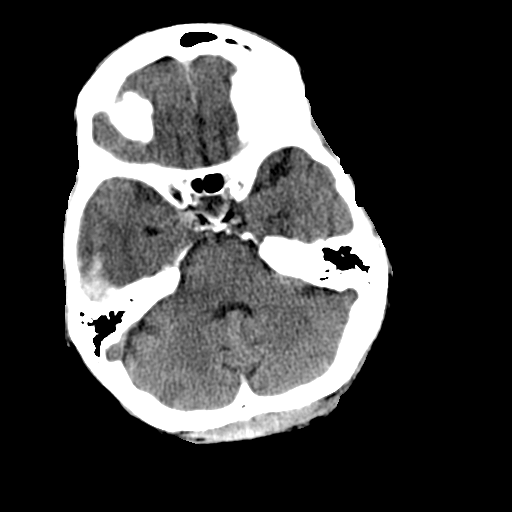
[im 10/36  bone]
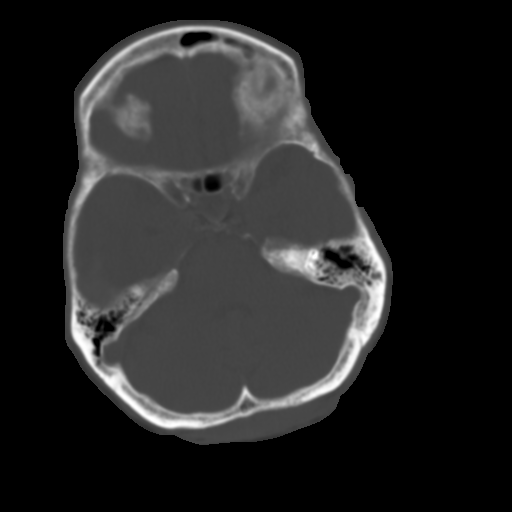
[im 13/36  brain]
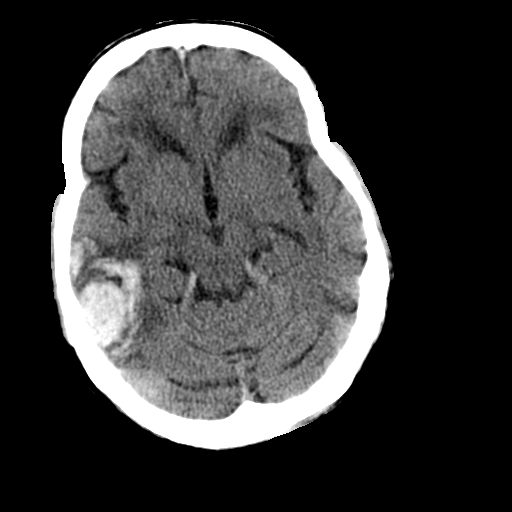
[im 15/36  brain]
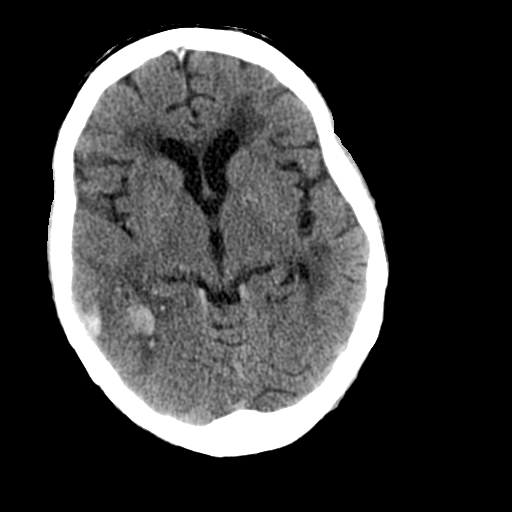
[im 17/36  brain]
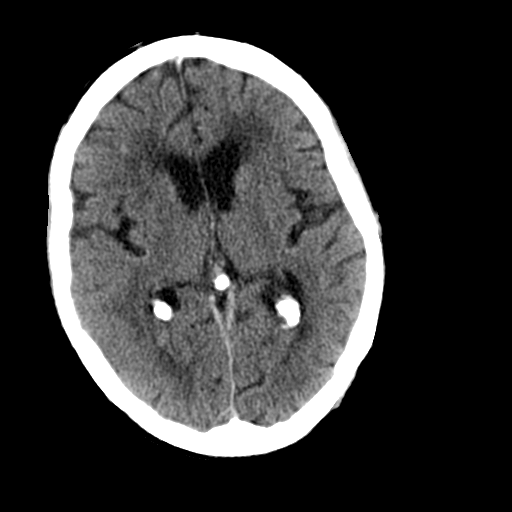
[im 19/36  brain]
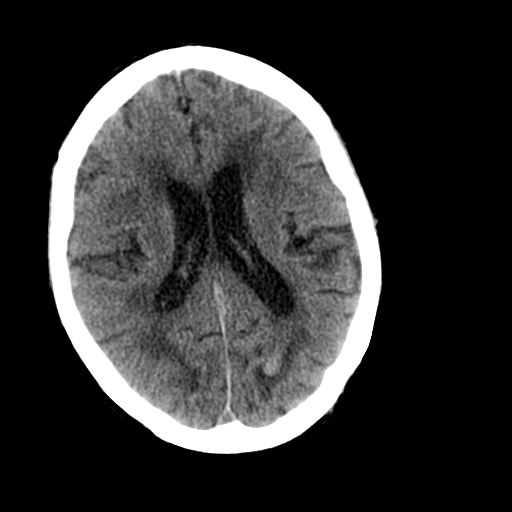
[im 19/36  bone]
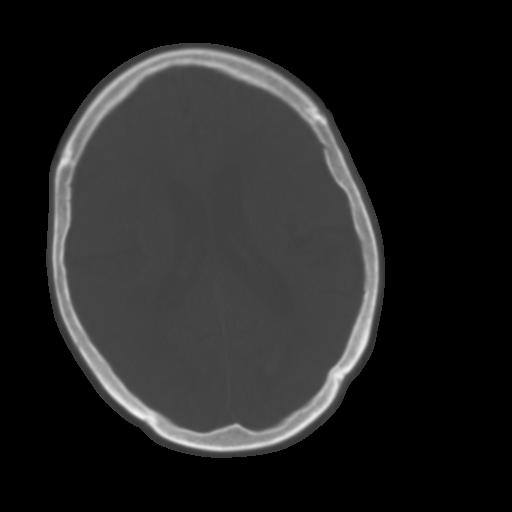
[im 21/36  brain]
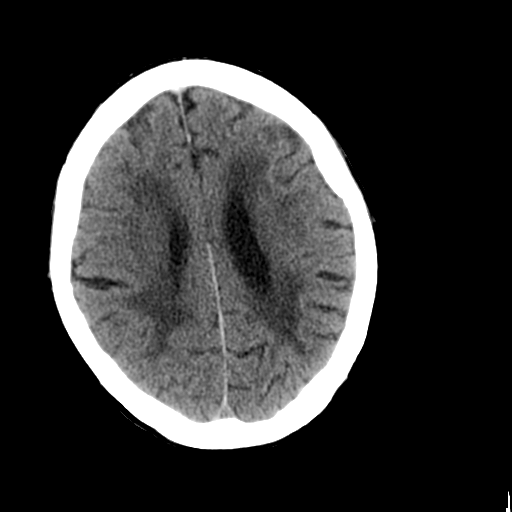
[im 23/36  brain]
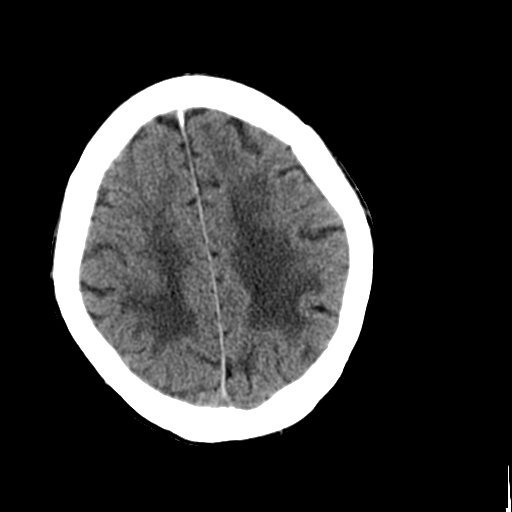
[im 26/36  brain]
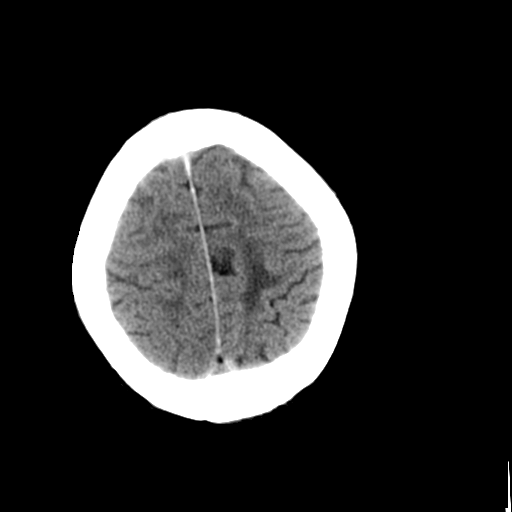
[im 27/36  brain]
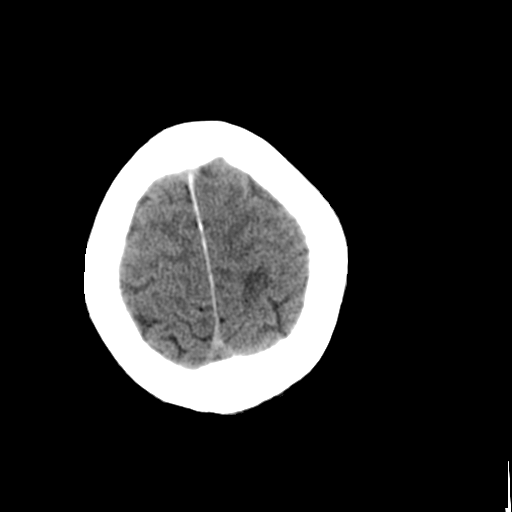
[im 27/36  bone]
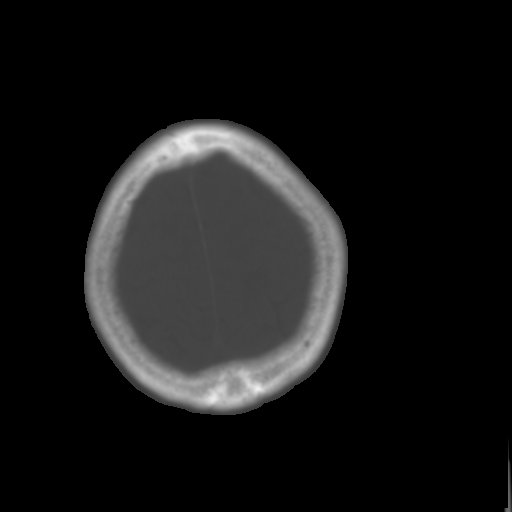
[im 29/36  brain]
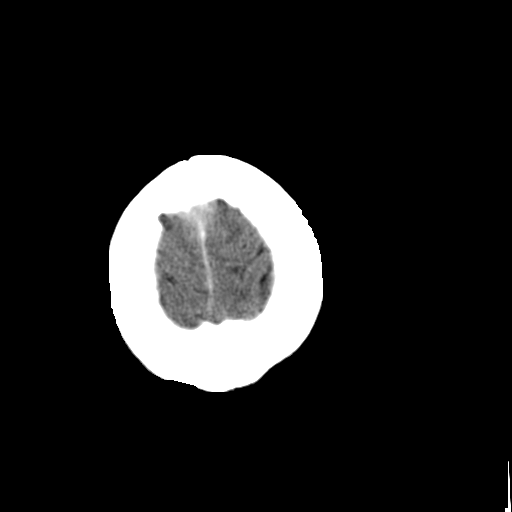
[im 32/36  brain]
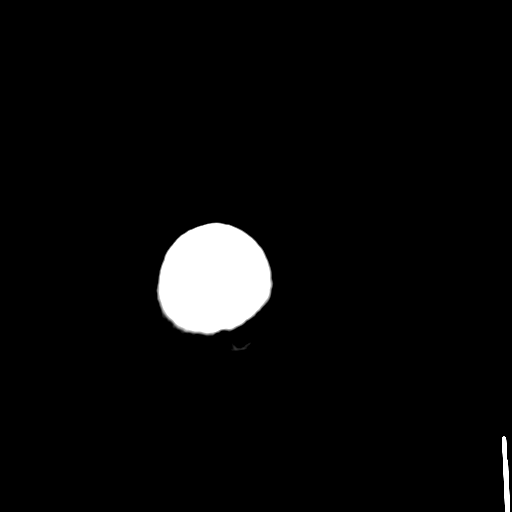
[im 34/36  brain]
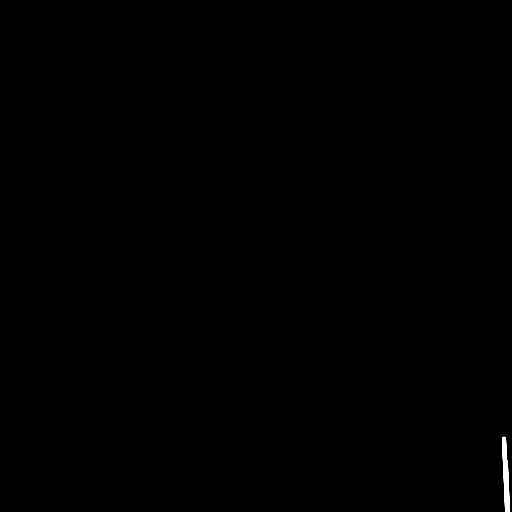

[16 of 30 positions shown; findings below may reference images not displayed]

FINDINGS: A 3 x 4.5 cm right temporal hematoma is noted with adjacent edema.
There appears to be hemorrhage within both occipital horns.

There is no evidence of midline shift or hydrocephalus.

Moderate chronic small-vessel white matter ischemic changes are
identified.

Bilateral mastoid effusions are identified.

No acute bony abnormalities are present.
IMPRESSION: 3 x 4.5 cm right temporal hematoma with probable blood within both
occipital horns. No evidence of midline shift or hydrocephalus.

Chronic small-vessel white matter ischemic changes and bilateral
mastoid effusions.

Critical Value/emergent results were called by telephone at the time
of interpretation on 01/05/2015 at [DATE] to Dr. YASUJI SAKIHAMA , who
verbally acknowledged these results.
